# Patient Record
Sex: Female | Born: 1995
Health system: Southern US, Community
[De-identification: ages and names within clinical notes are randomized; demographics above are authoritative.]

## PROBLEM LIST (undated history)

## (undated) ENCOUNTER — Inpatient Hospital Stay (HOSPITAL_COMMUNITY): Payer: Self-pay

## (undated) DIAGNOSIS — L309 Dermatitis, unspecified: Secondary | ICD-10-CM

## (undated) DIAGNOSIS — Z8759 Personal history of other complications of pregnancy, childbirth and the puerperium: Secondary | ICD-10-CM

## (undated) DIAGNOSIS — Z789 Other specified health status: Secondary | ICD-10-CM

## (undated) HISTORY — PX: NO PAST SURGERIES: SHX2092

## (undated) HISTORY — DX: Personal history of other complications of pregnancy, childbirth and the puerperium: Z87.59

---
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
## 2015-09-01 ENCOUNTER — Encounter: Payer: Self-pay | Admitting: Pediatrics

## 2015-09-01 NOTE — Progress Notes (Signed)
Pre-Visit Planning  [name] Schier  is a 20 y.o. female referred by Venia MinksSIMHA,SHRUTI VIJAYA, MD.   [facility] on 06/11/13 for nexplanon placement.   Previous Psych Screenings? no  Treatment plan at last visit included nexplanon placement.   Clinical Staff Visit Tasks:   - Urine GC/CT due? yes - Psych Screenings Due? no - assess concern for visit in triage-- if vaginal bleeding or odor have patient undress from waist down and prep for pelvic  - offer flu shot  Provider Visit Tasks: - discuss current concerns  - [facility] - Pertinent Labs? no

## 2015-09-02 ENCOUNTER — Ambulatory Visit (INDEPENDENT_AMBULATORY_CARE_PROVIDER_SITE_OTHER): Payer: Medicaid Other | Admitting: Pediatrics

## 2015-09-02 ENCOUNTER — Other Ambulatory Visit: Payer: Self-pay | Admitting: Pediatrics

## 2015-09-02 ENCOUNTER — Encounter: Payer: Self-pay | Admitting: Pediatrics

## 2015-09-02 VITALS — BP 126/69 | HR 92 | Ht 61.26 in | Wt 202.0 lb

## 2015-09-02 DIAGNOSIS — Z113 Encounter for screening for infections with a predominantly sexual mode of transmission: Secondary | ICD-10-CM

## 2015-09-02 DIAGNOSIS — R51 Headache: Secondary | ICD-10-CM

## 2015-09-02 DIAGNOSIS — R635 Abnormal weight gain: Secondary | ICD-10-CM | POA: Diagnosis not present

## 2015-09-02 DIAGNOSIS — N921 Excessive and frequent menstruation with irregular cycle: Secondary | ICD-10-CM | POA: Diagnosis not present

## 2015-09-02 DIAGNOSIS — R519 Headache, unspecified: Secondary | ICD-10-CM

## 2015-09-02 DIAGNOSIS — Z975 Presence of (intrauterine) contraceptive device: Secondary | ICD-10-CM

## 2015-09-02 LAB — HEMOGLOBIN A1C
Hgb A1c MFr Bld: 5.9 % — ABNORMAL HIGH (ref <5.7)
MEAN PLASMA GLUCOSE: 123 mg/dL — AB (ref <117)

## 2015-09-02 LAB — CBC
HCT: 41.9 % (ref 36.0–46.0)
HEMOGLOBIN: 13.4 g/dL (ref 12.0–15.0)
MCH: 24.5 pg — AB (ref 26.0–34.0)
MCHC: 32 g/dL (ref 30.0–36.0)
MCV: 76.5 fL — ABNORMAL LOW (ref 78.0–100.0)
MPV: 11.1 fL (ref 8.6–12.4)
Platelets: 210 10*3/uL (ref 150–400)
RBC: 5.48 MIL/uL — AB (ref 3.87–5.11)
RDW: 16 % — ABNORMAL HIGH (ref 11.5–15.5)
WBC: 6.2 10*3/uL (ref 4.0–10.5)

## 2015-09-02 NOTE — Progress Notes (Signed)
THIS RECORD MAY CONTAIN CONFIDENTIAL INFORMATION THAT SHOULD NOT BE RELEASED WITHOUT REVIEW OF THE SERVICE PROVIDER.  Adolescent Medicine Consultation Follow-Up Visit Grace Harper  is a 20 y.o. female referred by Marijo File, MD here today for follow-up.    Previsit planning completed:  Yes  Pre-Visit Planning  Grace Harper is a 20 y.o. female referred by Venia Minks, MD.  Last seen in [facility] on 06/11/13 for nexplanon placement.   Previous Psych Screenings? no  Treatment plan at last visit included nexplanon placement.   Clinical Staff Visit Tasks:  - Urine GC/CT due? yes - Psych Screenings Due? no - assess concern for visit in triage-- if vaginal bleeding or odor have patient undress from waist down and prep for pelvic  - offer flu shot  Provider Visit Tasks: - discuss current concerns  - [facility] Involvement? Unknown - Pertinent Labs? no   Growth Chart Viewed? yes   History was provided by the patient.  PCP Confirmed?  yes  My Chart Activated?   yes   HPI:   Feels like she can't lose weight and it is stressing her out. Has been on phentermine (a few months ago) and has been exercising and eating healthy. Going to a clinicEcologist for weight loss. Has been going for about 3-4 months. Stopped phentermine 2 months ago-- feels like it wasn't helping at all. Goes to [employer] a week. Does treadmill, bike, crunch machine. Walking at a 2.0 on treadmill x 30 minutes. Was eating baked fish, chicken, veggies. Was doing about 2 weeks of no carb but feels like it didn't help. Prior to dieting- eating ok but continue to drink a lot of sugar. Continues with 2-4 sugary drinks a day. Was a former track runner prior to pregnancy.   She is getting bad headaches. They last about a day. Takes tylenol and advil but they don't go away. They are frontal. She has to sleep to get it away. Happening about twice a month. No aura. Was having them  some before nexplanon.   Periods are lasting 3-4 weeks. They are heavy. Periods were 3 days and light- they became heavier around November and have been lasting longer. Sexually same partner since last visit. Not using condoms. Some lower back pain but likely unrelated. No pain with sex or odor. No unusual discharge. No pain with urination.   She has looked a lot on the internet about different BC methods. Forgets to take pills. Hasn't considered IUD before. Thinks maybe she will just use condoms if she switches methods although she is able to verbalize how unreliable this is.   PHQ-SADS 09/04/2015  PHQ-15 2  GAD-7 0  PHQ-9 0  Suicidal Ideation No    Patient's last menstrual period was 07/19/2015. No Known Allergies Outpatient Encounter Prescriptions as of 09/02/2015  Medication Sig  . etonogestrel (NEXPLANON) 68 MG IMPL implant Inject 1 each (68 mg total) into the skin once.  . [DISCONTINUED] promethazine (PHENERGAN) 25 MG tablet Take 1 tablet (25 mg total) by mouth every 6 (six) hours as needed for nausea.  . [DISCONTINUED] promethazine (PHENERGAN) 25 MG tablet Take 1 tablet (25 mg total) by mouth every 6 (six) hours as needed for nausea or vomiting.   No facility-administered encounter medications on file as of 09/02/2015.     Patient Active Problem List   Diagnosis Date Noted  . Teenage mother 06/11/2013  . Contraception management 06/11/2013  . Presence of subdermal contraceptive  device 06/11/2013     Social History   Social History Narrative    Review of Systems  Constitutional: Negative for weight loss and malaise/fatigue.  Eyes: Negative for blurred vision.  Respiratory: Negative for shortness of breath.   Cardiovascular: Negative for chest pain and palpitations.  Gastrointestinal: Negative for nausea, vomiting, abdominal pain and constipation.  Genitourinary: Negative for dysuria.  Musculoskeletal: Negative for myalgias.  Neurological: Positive for headaches. Negative  for dizziness.  Psychiatric/Behavioral: Negative for depression.     The following portions of the patient's history were reviewed and updated as appropriate: allergies, current medications, past family history, past medical history, past social history and problem list.  Physical Exam:  Filed Vitals:   09/02/15 1041  BP: 126/69  Pulse: 92  Height: 5' 1.26" (1.556 m)  Weight: 202 lb (91.627 kg)   BP 126/69 mmHg  Pulse 92  Ht 5' 1.26" (1.556 m)  Wt 202 lb (91.627 kg)  BMI 37.84 kg/m2  LMP 07/19/2015 Body mass index: body mass index is 37.84 kg/(m^2). Blood pressure percentiles are 97% systolic and 75% diastolic based on 2000 NHANES data. Blood pressure percentile targets: 90: 120/76, 95: 123/79, 99 + 5 mmHg: 136/92.  Physical Exam  Constitutional: She is oriented to person, place, and time. She appears well-developed and well-nourished.  HENT:  Head: Normocephalic.  Neck: No thyromegaly present.  Mild acanthosis  Cardiovascular: Normal rate, regular rhythm, normal heart sounds and intact distal pulses.   Pulmonary/Chest: Effort normal and breath sounds normal.  Abdominal: Soft. Bowel sounds are normal. There is no tenderness.  Musculoskeletal: Normal range of motion.  Neurological: She is alert and oriented to person, place, and time.  Skin: Skin is warm and dry.  Psychiatric: She has a normal mood and affect.    Assessment/Plan: 1. Breakthrough bleeding on Nexplanon Screened for infection. Gc/chlamydia negative. + for BV. Will treat to see if this resolves BTB. If not, will attempt OCP coverage to regulate bleeding.  - WET PREP BY MOLECULAR PROBE  2. Presence of subdermal contraceptive device Implant in place. Due for removal or reinsertion in October.  - WET PREP BY MOLECULAR PROBE  3. Weight gain Patient feels like weight gain is related to contraceptive device, however, in discussing history her intake + lack of strenuous exercise seem to be the culprit. Given that  she hasn't had any labs in some time, will screen for any other causes of weight gain. Will review these labs in 2 weeks at visit. Patient is agreeable to dietitian referral and workout suggestions prior to wanting nexplanon removed.  - Comprehensive metabolic panel - Hemoglobin A1c - TSH - CBC - Lipid panel - Amb ref to Medical Nutrition Therapy-MNT  4. Generalized headaches Headaches likely unrelated to nexplanon, however, can continue to assess further. Could benefit from topamax for headaches + appetite in the future.   5. Screening examination for venereal disease Negative.  - GC/chlamydia probe amp, urine - WET PREP BY MOLECULAR PROBE   Follow-up:  2 weeks    Medical decision-making:  > 40 minutes spent, more than 50% of appointment was spent discussing diagnosis and management of symptoms

## 2015-09-02 NOTE — Patient Instructions (Addendum)
Treadmill at at least a 3.5 for 30 minutes 4x a week  Check out [employer] app on your phone  Find sugar free beverages- 1 pepsi a day is a pound a month weight gain!  Continue teas without sugar  Add lemons to tea or water   Go see dietitian   I will send labs today for your bleeding- we will make sure no infection If not, I will send pills to take daily to get it regulated again.   Dietitian will call you for an appointment.  Wellbutrin   [email or web address]

## 2015-09-03 ENCOUNTER — Other Ambulatory Visit: Payer: Self-pay | Admitting: Pediatrics

## 2015-09-03 DIAGNOSIS — N760 Acute vaginitis: Principal | ICD-10-CM

## 2015-09-03 DIAGNOSIS — B9689 Other specified bacterial agents as the cause of diseases classified elsewhere: Secondary | ICD-10-CM

## 2015-09-03 LAB — LIPID PANEL
CHOL/HDL RATIO: 5.6 ratio — AB (ref <=5.0)
CHOLESTEROL: 208 mg/dL — AB (ref 125–170)
HDL: 37 mg/dL (ref 36–76)
LDL Cholesterol: 147 mg/dL — ABNORMAL HIGH (ref <110)
Triglycerides: 118 mg/dL (ref 40–136)
VLDL: 24 mg/dL (ref <30)

## 2015-09-03 LAB — COMPREHENSIVE METABOLIC PANEL
ALBUMIN: 4.2 g/dL (ref 3.6–5.1)
ALT: 11 U/L (ref 5–32)
AST: 15 U/L (ref 12–32)
Alkaline Phosphatase: 109 U/L (ref 47–176)
BILIRUBIN TOTAL: 0.3 mg/dL (ref 0.2–1.1)
BUN: 12 mg/dL (ref 7–20)
CALCIUM: 9.4 mg/dL (ref 8.9–10.4)
CO2: 27 mmol/L (ref 20–31)
CREATININE: 0.79 mg/dL (ref 0.50–1.00)
Chloride: 102 mmol/L (ref 98–110)
Glucose, Bld: 91 mg/dL (ref 65–99)
Potassium: 5 mmol/L (ref 3.8–5.1)
Sodium: 137 mmol/L (ref 135–146)
TOTAL PROTEIN: 7.3 g/dL (ref 6.3–8.2)

## 2015-09-03 LAB — WET PREP BY MOLECULAR PROBE
Candida species: NEGATIVE
Gardnerella vaginalis: POSITIVE — AB
Trichomonas vaginosis: NEGATIVE

## 2015-09-03 LAB — GC/CHLAMYDIA PROBE AMP
CT Probe RNA: NOT DETECTED
GC PROBE AMP APTIMA: NOT DETECTED

## 2015-09-03 LAB — TSH: TSH: 2.046 u[IU]/mL (ref 0.350–4.500)

## 2015-09-03 MED ORDER — METRONIDAZOLE 500 MG PO TABS: 500.0000 mg | ORAL_TABLET | Freq: Two times a day (BID) | ORAL | Status: DC

## 2015-09-04 ENCOUNTER — Telehealth: Payer: Self-pay | Admitting: *Deleted

## 2015-09-04 DIAGNOSIS — R51 Headache: Secondary | ICD-10-CM

## 2015-09-04 DIAGNOSIS — Z975 Presence of (intrauterine) contraceptive device: Principal | ICD-10-CM

## 2015-09-04 DIAGNOSIS — N921 Excessive and frequent menstruation with irregular cycle: Secondary | ICD-10-CM | POA: Insufficient documentation

## 2015-09-04 DIAGNOSIS — R635 Abnormal weight gain: Secondary | ICD-10-CM | POA: Insufficient documentation

## 2015-09-04 DIAGNOSIS — R519 Headache, unspecified: Secondary | ICD-10-CM | POA: Insufficient documentation

## 2015-09-04 NOTE — Telephone Encounter (Signed)
-----   Message from Verneda Skill, FNP sent at 09/03/2015  4:45 PM EST ----- Gc/chlamydia negative. Swab positive for BV. I have sent over flagyl 500 mg BID x 7 days for this. If bleeding hasn't stopped at next visit in 2 weeks, will start OCP for her. We will discuss other labs at her next visit in 2 weeks.

## 2015-09-04 NOTE — Telephone Encounter (Signed)
LVM requesting call back to discuss labs and necessary f/u. Phone number provided.

## 2015-09-04 NOTE — Telephone Encounter (Addendum)
TC returned by pt. Advised pt of BV dx, meds, and f/u appt. Answered all of pt's questions to satisfaction. Pt verbalized understanding of treatment plan. Will call back if questions arise.

## 2015-09-16 ENCOUNTER — Ambulatory Visit (INDEPENDENT_AMBULATORY_CARE_PROVIDER_SITE_OTHER): Payer: Medicaid Other | Admitting: Pediatrics

## 2015-09-16 ENCOUNTER — Encounter: Payer: Self-pay | Admitting: Pediatrics

## 2015-09-16 VITALS — BP 120/65 | HR 83 | Ht 60.83 in | Wt 203.4 lb

## 2015-09-16 DIAGNOSIS — E785 Hyperlipidemia, unspecified: Secondary | ICD-10-CM | POA: Insufficient documentation

## 2015-09-16 DIAGNOSIS — Z975 Presence of (intrauterine) contraceptive device: Secondary | ICD-10-CM

## 2015-09-16 DIAGNOSIS — A499 Bacterial infection, unspecified: Secondary | ICD-10-CM | POA: Diagnosis not present

## 2015-09-16 DIAGNOSIS — L309 Dermatitis, unspecified: Secondary | ICD-10-CM

## 2015-09-16 DIAGNOSIS — N921 Excessive and frequent menstruation with irregular cycle: Secondary | ICD-10-CM | POA: Diagnosis not present

## 2015-09-16 DIAGNOSIS — B9689 Other specified bacterial agents as the cause of diseases classified elsewhere: Secondary | ICD-10-CM

## 2015-09-16 DIAGNOSIS — N760 Acute vaginitis: Secondary | ICD-10-CM

## 2015-09-16 DIAGNOSIS — R7303 Prediabetes: Secondary | ICD-10-CM | POA: Insufficient documentation

## 2015-09-16 DIAGNOSIS — R635 Abnormal weight gain: Secondary | ICD-10-CM

## 2015-09-16 MED ORDER — TRIAMCINOLONE ACETONIDE 0.1 % EX CREA: 1.0000 "application " | TOPICAL_CREAM | Freq: Two times a day (BID) | CUTANEOUS | Status: DC

## 2015-09-16 MED ORDER — METRONIDAZOLE 500 MG PO TABS: 500.0000 mg | ORAL_TABLET | Freq: Two times a day (BID) | ORAL | Status: DC

## 2015-09-16 MED FILL — metroNIDAZOLE 500 MG TABS: 500 | 7 days supply | Qty: 14 | Fill #0

## 2015-09-16 MED FILL — TRIAMCINOLONE 0.1% CREAM: 0.1 | 14 days supply | Qty: 45 | Fill #0

## 2015-09-16 NOTE — Progress Notes (Signed)
THIS RECORD MAY CONTAIN CONFIDENTIAL INFORMATION THAT SHOULD NOT BE RELEASED WITHOUT REVIEW OF THE SERVICE PROVIDER.  Adolescent Medicine Consultation Follow-Up Visit Grace Harper  is a 20 y.o. female referred by Marijo File, MD here today for follow-up.    Previsit planning completed:  No  Results for orders placed or performed in visit on 09/02/15  WET PREP BY MOLECULAR PROBE  Result Value Ref Range   Candida species NEG Negative   Trichomonas vaginosis NEG Negative   Gardnerella vaginalis POS (A) Negative  Comprehensive metabolic panel  Result Value Ref Range   Sodium 137 135 - 146 mmol/L   Potassium 5.0 3.8 - 5.1 mmol/L   Chloride 102 98 - 110 mmol/L   CO2 27 20 - 31 mmol/L   Glucose, Bld 91 65 - 99 mg/dL   BUN 12 7 - 20 mg/dL   Creat 0.96 0.45 - 4.09 mg/dL   Total Bilirubin 0.3 0.2 - 1.1 mg/dL   Alkaline Phosphatase 109 47 - 176 U/L   AST 15 12 - 32 U/L   ALT 11 5 - 32 U/L   Total Protein 7.3 6.3 - 8.2 g/dL   Albumin 4.2 3.6 - 5.1 g/dL   Calcium 9.4 8.9 - 81.1 mg/dL  Hemoglobin B1Y  Result Value Ref Range   Hgb A1c MFr Bld 5.9 (H) <5.7 %   Mean Plasma Glucose 123 (H) <117 mg/dL  TSH  Result Value Ref Range   TSH 2.046 0.350 - 4.500 uIU/mL  CBC  Result Value Ref Range   WBC 6.2 4.0 - 10.5 K/uL   RBC 5.48 (H) 3.87 - 5.11 MIL/uL   Hemoglobin 13.4 12.0 - 15.0 g/dL   HCT 78.2 95.6 - 21.3 %   MCV 76.5 (L) 78.0 - 100.0 fL   MCH 24.5 (L) 26.0 - 34.0 pg   MCHC 32.0 30.0 - 36.0 g/dL   RDW 08.6 (H) 57.8 - 46.9 %   Platelets 210 150 - 400 K/uL   MPV 11.1 8.6 - 12.4 fL  Lipid panel  Result Value Ref Range   Cholesterol 208 (H) 125 - 170 mg/dL   Triglycerides 629 40 - 136 mg/dL   HDL 37 36 - 76 mg/dL   Total CHOL/HDL Ratio 5.6 (H) <=5.0 Ratio   VLDL 24 <30 mg/dL   LDL Cholesterol 528 (H) <110 mg/dL    Growth Chart Viewed? yes   History was provided by the patient.  PCP Confirmed?  yes  My Chart Activated?   yes   HPI:    Had a migraine about 3 days  ago. Went to sleep and slept through the night and it went away.  Started diet green tea, water, baked chicken, veggies.  Has been to workout about 3 times at the gym. Did treadmill and went at 3.5-4.2 speed. Did leg exercises. Very sore today.  Hasn't been bleeding since last period.  Although she continues to be concerned about weight gain related to her nexplanon, she is able to rationalize she doesn't want to get pregnant.   Mom and grandma has diabetes. Mom is on insulin. She has never been told she has prediabetes in the past.   She is due for new contraception in October. Feels like she would like IUD next. Discussed process of nexplanon removal and IUD insertion on the same day.     Patient's last menstrual period was 08/19/2015. No Known Allergies Outpatient Encounter Prescriptions as of 09/16/2015  Medication Sig  . etonogestrel (NEXPLANON)  68 MG IMPL implant Inject 1 each (68 mg total) into the skin once.  . metroNIDAZOLE (FLAGYL) 500 MG tablet Take 1 tablet (500 mg total) by mouth 2 (two) times daily.   No facility-administered encounter medications on file as of 09/16/2015.    Review of Systems  Constitutional: Negative for weight loss and malaise/fatigue.  Eyes: Negative for blurred vision.  Respiratory: Negative for shortness of breath.   Cardiovascular: Negative for chest pain and palpitations.  Gastrointestinal: Negative for nausea, vomiting, abdominal pain and constipation.  Genitourinary: Negative for dysuria.  Musculoskeletal: Negative for myalgias.  Neurological: Positive for headaches. Negative for dizziness.  Psychiatric/Behavioral: Negative for depression.     Patient Active Problem List   Diagnosis Date Noted  . Weight gain 09/04/2015  . Breakthrough bleeding on Nexplanon 09/04/2015  . Generalized headaches 09/04/2015  . Teenage mother 06/11/2013  . Contraception management 06/11/2013  . Presence of subdermal contraceptive device 06/11/2013     Social  History   Social History Narrative     The following portions of the patient's history were reviewed and updated as appropriate: allergies, current medications, past family history, past medical history, past social history and problem list.  Physical Exam:  Filed Vitals:   09/16/15 0928  BP: 120/65  Pulse: 83  Height: 5' 0.83" (1.545 m)  Weight: 203 lb 6.4 oz (92.262 kg)   BP 120/65 mmHg  Pulse 83  Ht 5' 0.83" (1.545 m)  Wt 203 lb 6.4 oz (92.262 kg)  BMI 38.65 kg/m2  LMP 08/19/2015 Body mass index: body mass index is 38.65 kg/(m^2). Blood pressure percentiles are 91% systolic and 63% diastolic based on 2000 NHANES data. Blood pressure percentile targets: 90: 119/75, 95: 123/79, 99 + 5 mmHg: 135/92.  Physical Exam  Constitutional: She is oriented to person, place, and time. She appears well-developed and well-nourished.  HENT:  Head: Normocephalic.  Neck: No thyromegaly present.  Cardiovascular: Normal rate, regular rhythm, normal heart sounds and intact distal pulses.   Pulmonary/Chest: Effort normal and breath sounds normal.  Abdominal: Soft. Bowel sounds are normal. There is no tenderness.  Musculoskeletal: Normal range of motion.  Neurological: She is alert and oriented to person, place, and time.  Skin: Skin is warm and dry.  Psychiatric: She has a normal mood and affect.    Assessment/Plan: 1. Prediabetes Discussed risks given family history today. She feels committed to trying lifestyle changes vs. Adding metformin. Will work on those for 3 months.   2. Breakthrough bleeding on Nexplanon No significant bleeding since last period. Will monitor.   3. Weight gain Will monitor. She has made some changes that will hopefully help with weight loss.   4. Bacterial vaginosis She was unable to pick up flagyl after last appointment due to only having family planning medicaid. [facility].  - metroNIDAZOLE (FLAGYL) 500 MG  tablet; Take 1 tablet (500 mg total) by mouth 2 (two) times daily.  Dispense: 14 tablet; Refill: 0  5. Eczema Spots on hands, ankles, knees. Has had in the past. Will try triamcinolone and aquaphor.  - triamcinolone cream (KENALOG) 0.1 %; Apply 1 application topically 2 (two) times daily.  Dispense: 45 g; Refill: 3   Follow-up:  3 months for IUD insertion and nexplanon removal.   Medical decision-making:  > 40 minutes spent, more than 50% of appointment was spent discussing diagnosis and management of symptoms

## 2015-09-16 NOTE — Patient Instructions (Addendum)
Work up to 5 days a week working out!   Call us to schedule sooner if you want for IUD + nexplanon removal.   Go see dietitian.   Aquaphor for eczema spots with triamcinolone cream

## 2015-09-28 ENCOUNTER — Ambulatory Visit: Payer: Self-pay | Admitting: Skilled Nursing Facility1

## 2015-12-13 ENCOUNTER — Encounter: Payer: Self-pay | Admitting: Family

## 2015-12-13 NOTE — Progress Notes (Signed)
[name], female   DOB: 10-02-1995, 20 y.o.   MRN: [id number] Pre-Visit Planning  [name]  is a 20 y.o. female referred by Venia MinksSIMHA,SHRUTI VIJAYA, MD.   Last seen in [facility] on 09/16/15 for prediabetes, BTB with Nexplanon.   Date and Type of Previous Psych Screenings? NA  Clinical Staff Visit Tasks:   - Urine GC/CT due? no - HIV Screening due?  no - Psych Screenings Due? No - Prepare for Nexplanon removal/IUD insertion -uhcg, poct hgb  Provider Visit Tasks: - Assess contraceptive concerns, confirm desired method, proceed with insertions/removal - [facility] - Pertinent Labs? No  >2 minutes spent reviewing records and planning for patient's visit.

## 2015-12-14 ENCOUNTER — Ambulatory Visit (INDEPENDENT_AMBULATORY_CARE_PROVIDER_SITE_OTHER): Payer: Medicaid Other | Admitting: Family

## 2015-12-14 ENCOUNTER — Encounter: Payer: Self-pay | Admitting: Family

## 2015-12-14 VITALS — BP 121/70 | HR 79 | Ht 61.0 in | Wt 207.4 lb

## 2015-12-14 DIAGNOSIS — Z30430 Encounter for insertion of intrauterine contraceptive device: Secondary | ICD-10-CM | POA: Diagnosis not present

## 2015-12-14 DIAGNOSIS — Z113 Encounter for screening for infections with a predominantly sexual mode of transmission: Secondary | ICD-10-CM

## 2015-12-14 DIAGNOSIS — Z3046 Encounter for surveillance of implantable subdermal contraceptive: Secondary | ICD-10-CM | POA: Diagnosis not present

## 2015-12-14 DIAGNOSIS — Z130 Encounter for screening for diseases of the blood and blood-forming organs and certain disorders involving the immune mechanism: Secondary | ICD-10-CM

## 2015-12-14 DIAGNOSIS — Z3202 Encounter for pregnancy test, result negative: Secondary | ICD-10-CM

## 2015-12-14 LAB — POCT HEMOGLOBIN: Hemoglobin: 13.1 g/dL (ref 12.2–16.2)

## 2015-12-14 LAB — POCT URINE PREGNANCY: Preg Test, Ur: NEGATIVE

## 2015-12-14 MED ORDER — LEVONORGESTREL 20 MCG/24HR IU IUD: 1.0000 | INTRAUTERINE_SYSTEM | Freq: Once | INTRAUTERINE | Status: DC

## 2015-12-14 MED ORDER — LEVONORGESTREL 20 MCG/24HR IU IUD
INTRAUTERINE_SYSTEM | Freq: Once | INTRAUTERINE | Status: AC
  Administered 2015-12-14: 16:00:00 via INTRAUTERINE

## 2015-12-14 NOTE — Patient Instructions (Signed)

## 2015-12-14 NOTE — Procedures (Signed)
Mirena IUD Insertion   The pt presents for Mirena IUD placement.  No contraindications for placement.     Patient's last menstrual period was 11/13/2015.  [facility]: nexplanon removed same day    Risks & benefits of IUD discussed  The [facility]  Packaging instructions supplied to patient  Consent form signed.  The patient denies any allergies to anesthetics or antiseptics.   Procedure:  Pt was placed in lithotomy position.  Uterus was palpated and noted in anterior position.  Speculum was inserted.  GC/CT swab was used to collect sample for STI testing.  Tenaculum was used to stabilize the cervix by clasping at 12 o'clock  Betadine was used to clean the cervix and cervical os.  The uterus was sounded to 6.5 cm.  Mirena was inserted using manufacturer provided applicator.  Strings were trimmed to 3 cm external to os.  Tenaculum was removed.  Speculum was removed.   The patient was advised to move slowly from a supine to an upright position   The patient denied any concerns or complaints   The patient was instructed to schedule a follow-up appt in 1 month and to call sooner if any concerns.   The patient acknowledged agreement and understanding of the plan.

## 2015-12-14 NOTE — Progress Notes (Signed)
THIS RECORD MAY CONTAIN CONFIDENTIAL INFORMATION THAT SHOULD NOT BE RELEASED WITHOUT REVIEW OF THE SERVICE PROVIDER.  Adolescent Medicine Consultation Follow-Up Visit Grace Harper  is a 20 y.o. female referred by Grace Harper, Grace V, MD here today for follow-up.    Previsit planning completed:  Yes [name], female   DOB: [birth year], 20 y.o.   MRN: [id number] Pre-Visit Planning  Grace Harper  is a 20 y.o. female referred by Grace MinksSIMHA,Grace VIJAYA, MD.   Last seen in [facility] on 09/16/15 for prediabetes, BTB with Nexplanon.   Date and Type of Previous Psych Screenings? NA  Clinical Staff Visit Tasks:   - Urine GC/CT due? no - HIV Screening due?  no - Psych Screenings Due? No - Prepare for Nexplanon removal/IUD insertion -uhcg, poct hgb  Provider Visit Tasks: - Assess contraceptive concerns, confirm desired method, proceed with insertions/removal - [facility]? No - Pertinent Labs? No  >2 minutes spent reviewing records and planning for patient's visit. Growth Chart Viewed? no   History was provided by the patient.  PCP Confirmed?  Yes, Grace BrideShruti Simha, MD  My Chart Activated?  Yes  HPI:    -20 yo G1P1 presents for change in contraceptive method. Nexplanon palpable in L arm.  -no vaginal discharge changes, VB, lesions, pelvic or abdominal pain  -Desires IUD.   Patient's last menstrual period was 11/13/2015. No Known Allergies Outpatient Prescriptions Prior to Visit  Medication Sig Dispense Refill  . etonogestrel (NEXPLANON) 68 MG IMPL implant Inject 1 each (68 mg total) into the skin once. 1 each 0  . triamcinolone cream (KENALOG) 0.1 % Apply 1 application topically 2 (two) times daily. (Patient not taking: Reported on 12/14/2015) 45 g 3  . metroNIDAZOLE (FLAGYL) 500 MG tablet Take 1 tablet (500 mg total) by mouth 2 (two) times daily. 14 tablet 0   No facility-administered medications prior to visit.     Patient Active Problem  List   Diagnosis Date Noted  . Prediabetes 09/16/2015  . Hyperlipidemia 09/16/2015  . Weight gain 09/04/2015  . Breakthrough bleeding on Nexplanon 09/04/2015  . Generalized headaches 09/04/2015  . Teenage mother 06/11/2013  . Contraception management 06/11/2013  . Presence of subdermal contraceptive device 06/11/2013    Confidentiality was discussed with the patient and if applicable, with caregiver as well.  Patient's personal or confidential phone number: [phone]  The following portions of the patient's history were reviewed and updated as appropriate: allergies, current medications, past family history, past medical history, past social history, past surgical history and problem list.  Physical Exam:  Filed Vitals:   12/14/15 1407  BP: 121/70  Pulse: 79  Height: 5\' 1"  (1.549 m)  Weight: 207 lb 6.4 oz (94.076 kg)   BP 121/70 mmHg  Pulse 79  Ht 5\' 1"  (1.549 m)  Wt 207 lb 6.4 oz (94.076 kg)  BMI 39.21 kg/m2  LMP 11/13/2015 Body mass index: body mass index is 39.21 kg/(m^2). Facility age limit for growth percentiles is 20 years.  Physical Exam  Constitutional: She is oriented to person, place, and time. She appears well-developed and well-nourished.  HENT:  Head: Normocephalic.  Neck: No thyromegaly present.  Cardiovascular: Normal rate, regular rhythm, normal heart sounds and intact distal pulses.   Pulmonary/Chest: Effort normal and breath sounds normal.  Abdominal: Soft. Bowel sounds are normal. There is no tenderness.  Genitourinary: Vagina normal and uterus normal. No vaginal discharge found.  Cervix WNL, no CMT.   Musculoskeletal: Normal range  of motion.  Neurological: She is alert and oriented to person, place, and time.  Skin: Skin is warm and dry.  Nexplanon palpable L arm; removed without incident prior to IUD insertion   Psychiatric: She has a normal mood and affect.     Assessment/Plan: 1. Nexplanon removal -Risks & benefits of Nexplanon removal  discussed. Consent form signed.  The patient denies any allergies to anesthetics or antiseptics.  Procedure: Pt was placed in supine position. left arm was flexed at the elbow and externally rotated so that her wrist was parallel to her ear, The device was palpated and marked. The site was cleaned with Betadine. The area surrounding the device was covered with a sterile drape. 1% lidocaine was injected just under the device. A scalpel was used to create a small incision. The device was pushed towards the incision. Fibrous tissue surrounding the device was gradually removed from the device. The device was removed and measured to ensure all 4 cm of device was removed. Steri-strips were used to close the incision. Pressure dressing was applied to the patient.  The patient was instructed to removed the pressure dressing in 24 hrs.  The patient was advised to move slowly from a supine to an upright position  The patient denied any concerns or complaints  The patient was instructed to schedule a follow-up appt in 1 month. The patient will be called in 1 week to address any concerns.  2. Encounter for insertion of mirena IUD -as per procedure note. Dr. Marina Goodell attending.   3. Pregnancy examination or test, negative result -negative per protocol - POCT urine pregnancy  4. Screening for iron deficiency anemia -13.1, stable - POCT hemoglobin  5. Routine screening for STI (sexually transmitted infection) -per protocol - GC/Chlamydia Probe Amp  Follow-up:  Return in about 4 weeks (around 01/11/2016) for with Grace Dolin, FNP-C.   Medical decision-making:  > 25 minutes spent, more than 50% of appointment was spent discussing diagnosis and management of symptoms

## 2015-12-15 ENCOUNTER — Telehealth: Payer: Self-pay | Admitting: *Deleted

## 2015-12-15 LAB — GC/CHLAMYDIA PROBE AMP
CT PROBE, AMP APTIMA: NOT DETECTED
GC PROBE AMP APTIMA: NOT DETECTED

## 2015-12-15 NOTE — Telephone Encounter (Signed)
TC to pt. LVM that labs WNL.

## 2015-12-15 NOTE — Telephone Encounter (Signed)
-----   Message from Christianne Dolinhristy Millican, NP sent at 12/15/2015  1:48 PM EDT ----- Negative gc/c. Notify pt. Thx -cm

## 2015-12-17 ENCOUNTER — Ambulatory Visit: Payer: Self-pay | Admitting: Pediatrics

## 2016-01-11 ENCOUNTER — Ambulatory Visit: Payer: Medicaid Other | Admitting: Pediatrics

## 2016-01-13 ENCOUNTER — Encounter: Payer: Self-pay | Admitting: Pediatrics

## 2016-01-13 ENCOUNTER — Ambulatory Visit (INDEPENDENT_AMBULATORY_CARE_PROVIDER_SITE_OTHER): Payer: Medicaid Other | Admitting: Pediatrics

## 2016-01-13 VITALS — BP 152/73 | HR 80 | Ht 61.42 in | Wt 213.6 lb

## 2016-01-13 DIAGNOSIS — Z30431 Encounter for routine checking of intrauterine contraceptive device: Secondary | ICD-10-CM

## 2016-01-13 NOTE — Progress Notes (Signed)
THIS RECORD MAY CONTAIN CONFIDENTIAL INFORMATION THAT SHOULD NOT BE RELEASED WITHOUT REVIEW OF THE SERVICE PROVIDER.  Adolescent Medicine Consultation Follow-Up Visit Grace Harper  is a 20 y.o. female referred by Marijo FileSimha, Shruti V, MD here today for follow-up.    Previsit planning completed:  yes  Growth Chart Viewed? yes   History was provided by the patient.  PCP Confirmed?  yes  My Chart Activated?   yes   HPI:   Patient is here for follow=up after IUD insertion. She reports some vaginal bleeding for ~2 weeks after insertion. Now has stopped but she experiences occasional cramping. No vaginal discharge or irritation. No dyspareunia. Patient states boyfriend claims to be able to feel the strings, but denies poking or pain. Overall very pleased w/ IUD.  Denies: HA, dizziness, mood changes, fever, SOB, CP, abdom pain, n/v/d, or dysuria.   Patient's last menstrual period was 11/15/2015. No Known Allergies Outpatient Prescriptions Prior to Visit  Medication Sig Dispense Refill  . triamcinolone cream (KENALOG) 0.1 % Apply 1 application topically 2 (two) times daily. 45 g 3  . etonogestrel (NEXPLANON) 68 MG IMPL implant Inject 1 each (68 mg total) into the skin once. 1 each 0   No facility-administered medications prior to visit.     Patient Active Problem List   Diagnosis Date Noted  . IUD check up 01/13/2016  . Prediabetes 09/16/2015  . Hyperlipidemia 09/16/2015  . Weight gain 09/04/2015  . Generalized headaches 09/04/2015  . Teenage mother 06/11/2013  . Contraception management 06/11/2013    Social History: Lives with:  mother, brother, sister, and son and describes home situation as positive  Confidentiality was discussed with the patient and if applicable, with caregiver as well.  Physical Exam:  Filed Vitals:   01/13/16 1120  BP: 152/73  Pulse: 80  Height: 5' 1.42" (1.56 m)  Weight: 213 lb 9.6 oz (96.888 kg)   BP 152/73 mmHg  Pulse 80  Ht 5' 1.42" (1.56 m)   Wt 213 lb 9.6 oz (96.888 kg)  BMI 39.81 kg/m2  LMP 11/15/2015 Body mass index: body mass index is 39.81 kg/(m^2). Facility age limit for growth percentiles is 20 years.  Physical Exam General -- oriented x3, pleasant and cooperative. HEENT -- Head is normocephalic. PERRLA. EOMI. Ears, nose and throat were benign. Integument -- intact. No rash, erythema, or ecchymoses.  Chest -- good expansion. Lungs clear to auscultation. Cardiac -- RRR. No murmurs noted.  Abdomen -- soft, nontender. No masses palpable. Bowel sounds present. Female genitalia: normal external genitalia, vulva, vagina, cervix, uterus and adnexa Cervix: normal appearing cervix without discharge or lesions and IUD strings noted at os w/ ends curling posteriorly.   Assessment/Plan: IUD check up Patient denies any significant issues or symptoms at this time. Occasional cramping but she denies any effect on daily activities.  - visual assessment of IUD placement yielded visualization of IUD strings in proper placement suggestive of proper device placement. No irritation or friability to cervix. Patient tolerated procedure well and Nurse was present for exam. - F/u in 6 months.    Follow-up:  Return in about 6 months (around 07/15/2016).   Medical decision-making:  > 25 minutes spent, more than 50% of appointment was spent discussing diagnosis and management of symptoms   Kathee DeltonIan D McKeag, MD,MS,  PGY2 01/13/2016 12:31 PM

## 2016-01-13 NOTE — Assessment & Plan Note (Signed)
Patient denies any significant issues or symptoms at this time. Occasional cramping but she denies any effect on daily activities.  - visual assessment of IUD placement yielded visualization of IUD strings in proper placement suggestive of proper device placement. No irritation or friability to cervix. Patient tolerated procedure well and Nurse was present for exam. - F/u in 6 months.

## 2016-01-13 NOTE — Patient Instructions (Signed)
It was a pleasure seeing you today in our clinic. Today we discussed your IUD. Here is the treatment plan we have discussed and agreed upon together:  - The IUD strings were visualized today and appeared to be well placed. As we discussed in the clinic, these strings will continue to get softer with time and should eventually move themselves behind the cervix with time.  - If you have questions or begin to develop any vaginal bleeding, pain, or other issues do not hesitate to call for a follow-up appointment. Otherwise, we'd like to see you back in 6 months.   Intrauterine Device Insertion, Care After Refer to this sheet in the next few weeks. These instructions provide you with information on caring for yourself after your procedure. Your health care provider may also give you more specific instructions. Your treatment has been planned according to current medical practices, but problems sometimes occur. Call your health care provider if you have any problems or questions after your procedure. WHAT TO EXPECT AFTER THE PROCEDURE Insertion of the IUD may cause some discomfort, such as cramping. The cramping should improve after the IUD is in place. You may have bleeding after the procedure. This is normal. It varies from light spotting for a few days to menstrual-like bleeding. When the IUD is in place, a string will extend past the cervix into the vagina for 1-2 inches. The strings should not bother you or your partner. If they do, talk to your health care provider.  HOME CARE INSTRUCTIONS   Check your intrauterine device (IUD) to make sure it is in place before you resume sexual activity. You should be able to feel the strings. If you cannot feel the strings, something may be wrong. The IUD may have fallen out of the uterus, or the uterus may have been punctured (perforated) during placement. Also, if the strings are getting longer, it may mean that the IUD is being forced out of the uterus. You no  longer have full protection from pregnancy if any of these problems occur.  You may resume sexual intercourse if you are not having problems with the IUD. The copper IUD is considered immediately effective, and the hormone IUD works right away if inserted within 7 days of your period starting. You will need to use a backup method of birth control for 7 days if the IUD in inserted at any other time in your cycle.  Continue to check that the IUD is still in place by feeling for the strings after every menstrual period.  You may need to take pain medicine such as acetaminophen or ibuprofen. Only take medicines as directed by your health care provider. SEEK MEDICAL CARE IF:   You have bleeding that is heavier or lasts longer than a normal menstrual cycle.  You have a fever.  You have increasing cramps or abdominal pain not relieved with medicine.  You have abdominal pain that does not seem to be related to the same area of earlier cramping and pain.  You are lightheaded, unusually weak, or faint.  You have abnormal vaginal discharge or smells.  You have pain during sexual intercourse.  You cannot feel the IUD strings, or the IUD string has gotten longer.  You feel the IUD at the opening of the cervix in the vagina.  You think you are pregnant, or you miss your menstrual period.  The IUD string is hurting your sex partner. MAKE SURE YOU:  Understand these instructions.  Will watch your  condition.  Will get help right away if you are not doing well or get worse.   This information is not intended to replace advice given to you by your health care provider. Make sure you discuss any questions you have with your health care provider.   Document Released: 04/06/2011 Document Revised: 05/29/2013 Document Reviewed: 01/27/2013 Elsevier Interactive Patient Education [employer]2016 [employer]

## 2016-03-13 ENCOUNTER — Ambulatory Visit (HOSPITAL_COMMUNITY)
Admission: EM | Admit: 2016-03-13 | Discharge: 2016-03-13 | Disposition: A | Discharge status: Home / Self Care | Payer: Medicaid Other | Attending: Family Medicine | Admitting: Family Medicine

## 2016-03-13 ENCOUNTER — Encounter (HOSPITAL_COMMUNITY): Payer: Self-pay | Admitting: Family Medicine

## 2016-03-13 DIAGNOSIS — N390 Urinary tract infection, site not specified: Secondary | ICD-10-CM

## 2016-03-13 LAB — POCT URINALYSIS DIP (DEVICE)
Bilirubin Urine: NEGATIVE
GLUCOSE, UA: 100 mg/dL — AB
Hgb urine dipstick: NEGATIVE
Ketones, ur: NEGATIVE mg/dL
Nitrite: POSITIVE — AB
Protein, ur: NEGATIVE mg/dL
Specific Gravity, Urine: 1.01 (ref 1.005–1.030)
Urobilinogen, UA: 0.2 mg/dL (ref 0.0–1.0)
pH: 6.5 (ref 5.0–8.0)

## 2016-03-13 LAB — POCT PREGNANCY, URINE: Preg Test, Ur: NEGATIVE

## 2016-03-13 MED ORDER — NITROFURANTOIN MONOHYD MACRO 100 MG PO CAPS: 100.0000 mg | ORAL_CAPSULE | Freq: Two times a day (BID) | ORAL | Status: DC

## 2016-03-13 MED ORDER — NITROFURANTOIN MONOHYD MACRO 100 MG PO CAPS: 100.0000 mg | ORAL_CAPSULE | Freq: Two times a day (BID) | ORAL | 0 refills | Status: AC

## 2016-03-13 NOTE — ED Triage Notes (Signed)
Pt here for urinary frequency and irritation. sts taking AZO without relief.

## 2016-03-13 NOTE — ED Provider Notes (Signed)
CSN: [id number]     Arrival date & time 03/13/16  1521 History   First MD Initiated Contact with Patient 03/13/16 1606     Chief Complaint  Patient presents with  . Urinary Frequency   (Consider location/radiation/quality/duration/timing/severity/associated sxs/prior Treatment) Patient presents today for possible UTI. Symptoms started 2 days ago with dysuria, irritation, urinary frequency.  She denies urinary urgency, abdominal pain back pain or flank pain. Modifying factor: None.     Urinary Frequency     Past Medical History:  Diagnosis Date  . Medical history non-contributory    Past Surgical History:  Procedure Laterality Date  . CESAREAN SECTION N/A 05/08/2013   Procedure: CESAREAN SECTION;  Surgeon: Brock Bad, MD;  Location: [facility];  Service: Obstetrics;  Laterality: N/A;  . NO PAST SURGERIES     History reviewed. No pertinent family history. Social History  Substance Use Topics  . Smoking status: Never Smoker  . Smokeless tobacco: Never Used  . Alcohol use No   OB History    Gravida Para Term Preterm AB Living   0 0 1   SAB TAB Ectopic Multiple Live Births   0   0 0       Review of Systems  Constitutional: Negative for chills and fever.  Respiratory: Negative.   Cardiovascular: Negative.   Genitourinary: Positive for dysuria and frequency. Negative for flank pain and urgency.  Musculoskeletal: Negative for back pain.    Allergies  Review of patient's allergies indicates no known allergies.  Home Medications   Prior to Admission medications   Medication Sig Start Date End Date Taking? Authorizing Provider  nitrofurantoin, macrocrystal-monohydrate, (MACROBID) 100 MG capsule Take 1 capsule (100 mg total) by mouth 2 (two) times daily. 03/13/16 03/18/16  Lucia Estelle, NP  triamcinolone cream (KENALOG) 0.1 % Apply 1 application topically 2 (two) times daily. 09/16/15   Verneda Skill, FNP   Meds Ordered and Administered this Visit   Medications   nitrofurantoin (macrocrystal-monohydrate) (MACROBID) capsule 100 mg (not administered)    BP 117/65   Pulse 80   Temp 98.3 F (36.8 C)   Resp 18   SpO2 98%  No data found.   Physical Exam  Constitutional: She is oriented to person, place, and time. She appears well-developed and well-nourished.  Cardiovascular: Normal rate and regular rhythm.   Pulmonary/Chest: Effort normal and breath sounds normal.  Abdominal: Soft. Bowel sounds are normal. There is no tenderness.  Genitourinary:  Genitourinary Comments: Negative CVA tenderness  Neurological: She is alert and oriented to person, place, and time.    Urgent Care Course   Clinical Course    Procedures (including critical care time)  Labs Review Labs Reviewed  POCT URINALYSIS DIP (DEVICE) - Abnormal; Notable for the following:       Result Value   Glucose, UA 100 (*)    Nitrite POSITIVE (*)    Leukocytes, UA TRACE (*)    All other components within normal limits  URINE CULTURE  POCT PREGNANCY, URINE    Imaging Review No results found.   Visual Acuity Review  Right Eye Distance:   Left Eye Distance:   Bilateral Distance:    Right Eye Near:   Left Eye Near:    Bilateral Near:         MDM   1. UTI (lower urinary tract infection)    Prescriptions for Macrobid BID x 5 days given. Reviewed directions for usage and side effects. Patient states  understanding and will call with questions or problems. Patient instructed to call or follow up with his/her primary care doctor if failure to improve or change in symptoms. Discharge instruction given.      Lucia Estelle, NP 03/13/16 1759

## 2016-03-17 ENCOUNTER — Encounter: Payer: Self-pay | Admitting: Pediatrics

## 2016-05-31 ENCOUNTER — Encounter (HOSPITAL_COMMUNITY): Payer: Self-pay

## 2016-05-31 ENCOUNTER — Emergency Department (HOSPITAL_COMMUNITY)
Admission: EM | Admit: 2016-05-31 | Discharge: 2016-05-31 | Disposition: A | Discharge status: Home / Self Care | Payer: Medicaid Other | Attending: Emergency Medicine | Admitting: Emergency Medicine

## 2016-05-31 DIAGNOSIS — L409 Psoriasis, unspecified: Secondary | ICD-10-CM | POA: Diagnosis not present

## 2016-05-31 DIAGNOSIS — R21 Rash and other nonspecific skin eruption: Secondary | ICD-10-CM | POA: Diagnosis present

## 2016-05-31 HISTORY — DX: Dermatitis, unspecified: L30.9

## 2016-05-31 MED ORDER — HYDROXYZINE HCL 25 MG PO TABS: 25.0000 mg | ORAL_TABLET | Freq: Four times a day (QID) | ORAL | 0 refills | Status: DC

## 2016-05-31 NOTE — ED Notes (Signed)
Patient was alert, oriented and stable upon discharge. RN went over AVS and patient had no further questions.  

## 2016-05-31 NOTE — ED Notes (Signed)
Pt reports that she is complaint with her cream for her eczema.

## 2016-05-31 NOTE — ED Triage Notes (Addendum)
Pt presents with dry scaly areas on her elbows, ankles, hands and feet bilaterally. Pt states that the spots itch and sting when she gets them wet. Pt has a hx of eczema. A&Ox4. No drainage noted.

## 2016-05-31 NOTE — ED Provider Notes (Signed)
[facility]-EMERGENCY DEPT Provider Note   CSN: [id number] Arrival date & time: 05/31/16  4098   By signing my name below, I, Teofilo Pod, attest that this documentation has been prepared under the direction and in the presence of Buel Ream, PA-C. Electronically Signed: Teofilo Pod, ED Scribe. 05/31/2016. 8:43 PM.   History   Chief Complaint Chief Complaint  Patient presents with  . Rash   The history is provided by the patient. No language interpreter was used.   HPI Comments:  Grace Harper is a 20 y.o. female with PMHx of eczema who presents to the Emergency Department complaining of a constant, worsening rash to her elbows, ankles, and bilateral hands and feet x 45 days. Pt describes the rash as burning, stinging, painful and itchy. Pt states that the pain is exacerbated when taking a shower. Pt states that this rash is similar to her previous episodes of eczema, but states that her eczema is usually only on her elbows. Pt has been using triamcinolone cream that was initially helpful, but has not been working recently. Pt denies nausea, vomiting, abdominal pain, urinary symptoms, fever.   Past Medical History:  Diagnosis Date  . Eczema   . Medical history non-contributory     Patient Active Problem List   Diagnosis Date Noted  . IUD check up 01/13/2016  . Prediabetes 09/16/2015  . Hyperlipidemia 09/16/2015  . Weight gain 09/04/2015  . Generalized headaches 09/04/2015  . Teenage mother 06/11/2013  . Contraception management 06/11/2013    Past Surgical History:  Procedure Laterality Date  . CESAREAN SECTION N/A 05/08/2013   Procedure: CESAREAN SECTION;  Surgeon: Brock Bad, MD;  Location: [facility];  Service: Obstetrics;  Laterality: N/A;  . NO PAST SURGERIES      OB History    Gravida Para Term Preterm AB Living   1 1 1  0 0 1   SAB TAB Ectopic Multiple Live Births   0   0 0 1       Home Medications    Prior to Admission medications     Medication Sig Start Date End Date Taking? Authorizing Provider  hydrOXYzine (ATARAX/VISTARIL) 25 MG tablet Take 1 tablet (25 mg total) by mouth every 6 (six) hours. 05/31/16   Emi Holes, PA-C  triamcinolone cream (KENALOG) 0.1 % Apply 1 application topically 2 (two) times daily. 09/16/15   Verneda Skill, FNP    Family History History reviewed. No pertinent family history.  Social History Social History  Substance Use Topics  . Smoking status: Never Smoker  . Smokeless tobacco: Never Used  . Alcohol use No     Allergies   Review of patient's allergies indicates no known allergies.   Review of Systems Review of Systems  Constitutional: Negative for fever.  HENT: Negative for facial swelling.   Respiratory: Negative for shortness of breath.   Cardiovascular: Negative for chest pain.  Gastrointestinal: Negative for abdominal pain, nausea and vomiting.  Genitourinary: Negative for dysuria.  Musculoskeletal: Negative for back pain.  Skin: Positive for rash.     Physical Exam Updated Vital Signs BP 123/70 (BP Location: Left Arm)   Pulse 81   Temp 98.3 F (36.8 C) (Oral)   Resp 16   Ht 5\' 2"  (1.575 m)   Wt 97.7 kg   SpO2 98%   BMI 39.40 kg/m   Physical Exam  Constitutional: She appears well-developed and well-nourished. No distress.  HENT:  Head: Normocephalic and atraumatic.  Mouth/Throat:  Oropharynx is clear and moist. No oropharyngeal exudate.  Eyes: Conjunctivae are normal. Pupils are equal, round, and reactive to light. Right eye exhibits no discharge. Left eye exhibits no discharge. No scleral icterus.  Neck: Normal range of motion. Neck supple. No thyromegaly present.  Cardiovascular: Normal rate, regular rhythm, normal heart sounds and intact distal pulses.  Exam reveals no gallop and no friction rub.   No murmur heard. Pulmonary/Chest: Effort normal and breath sounds normal. No stridor. No respiratory distress. She has no wheezes. She has no rales.   Abdominal: Soft. Bowel sounds are normal. She exhibits no distension. There is no tenderness. There is no rebound and no guarding.  Musculoskeletal: Normal range of motion. She exhibits no edema.  Lymphadenopathy:    She has no cervical adenopathy.  Neurological: She is alert. Coordination normal.  Skin: Skin is warm and dry. No rash noted. She is not diaphoretic. No pallor.  Scaly rash to knees, elbows, bilateral feet and hands; no drainage or other signs of infection see image for more details  Psychiatric: She has a normal mood and affect.  Nursing note and vitals reviewed.                  ED Treatments / Results  DIAGNOSTIC STUDIES:  Oxygen Saturation is 98% on RA, normal by my interpretation.    COORDINATION OF CARE:  8:43 PM Discussed treatment plan with pt at bedside and pt agreed to plan.   Labs (all labs ordered are listed, but only abnormal results are displayed) Labs Reviewed - No data to display  EKG  EKG Interpretation None       Radiology No results found.  Procedures Procedures (including critical care time)  Medications Ordered in ED Medications - No data to display   Initial Impression / Assessment and Plan / ED Course  I have reviewed the triage vital signs and the nursing notes.  Pertinent labs & imaging results that were available during my care of the patient were reviewed by me and considered in my medical decision making (see chart for details).  Clinical Course    Patient with most likely psoriasis. No signs of infection. Patient advised to continue using triamcinolone cream but to follow-up to dermatology. Patient given information to follow up. Patient discharged with Vistaril for itching. Patient understands and agrees with plan. Patient vitals stable throughout ED course and discharged in satisfactory condition. Patient case also discussed with Dr. Jacqulyn BathLong who guided the patient's management and agrees with plan.  Final  Clinical Impressions(s) / ED Diagnoses   Final diagnoses:  Psoriasis    New Prescriptions Discharge Medication List as of 05/31/2016  9:20 PM    START taking these medications   Details  hydrOXYzine (ATARAX/VISTARIL) 25 MG tablet Take 1 tablet (25 mg total) by mouth every 6 (six) hours., Starting Tue 05/31/2016, Print      I personally performed the services described in this documentation, which was scribed in my presence. The recorded information has been reviewed and is accurate.    Emi Holeslexandra M Atreyu Mak, PA-C 06/01/16 1218    Maia PlanJoshua G Long, MD 06/01/16 1736

## 2016-05-31 NOTE — Discharge Instructions (Signed)
Medications: Atarax  Treatment: Take Atarax every 6 hours as needed for itching. Do not drive or operate machinery when taking this medication. Only take as prescribed. Continue to apply triamcinolone cream as prescribed. You can also use moisturizing lotions.  Follow-up: Please follow-up with Dr. Margo AyeHall, a dermatologist, as soon as possible for further evaluation and treatment of your symptoms. Please return to emergency department if you develop any new or worsening symptoms.

## 2016-09-23 ENCOUNTER — Ambulatory Visit (HOSPITAL_COMMUNITY)
Admission: EM | Admit: 2016-09-23 | Discharge: 2016-09-23 | Disposition: A | Discharge status: Home / Self Care | Payer: Medicaid Other | Attending: Family Medicine | Admitting: Family Medicine

## 2016-09-23 ENCOUNTER — Encounter (HOSPITAL_COMMUNITY): Payer: Self-pay | Admitting: Family Medicine

## 2016-09-23 DIAGNOSIS — R110 Nausea: Secondary | ICD-10-CM

## 2016-09-23 DIAGNOSIS — R6889 Other general symptoms and signs: Secondary | ICD-10-CM

## 2016-09-23 DIAGNOSIS — G43A0 Cyclical vomiting, not intractable: Secondary | ICD-10-CM

## 2016-09-23 DIAGNOSIS — R1115 Cyclical vomiting syndrome unrelated to migraine: Secondary | ICD-10-CM

## 2016-09-23 MED ORDER — ONDANSETRON 4 MG PO TBDP: 4.0000 mg | ORAL_TABLET | Freq: Three times a day (TID) | ORAL | 0 refills | Status: DC | PRN

## 2016-09-23 MED ORDER — OSELTAMIVIR PHOSPHATE 75 MG PO CAPS: 75.0000 mg | ORAL_CAPSULE | Freq: Two times a day (BID) | ORAL | 0 refills | Status: DC

## 2016-09-23 NOTE — ED Provider Notes (Signed)
CSN: [id number]     Arrival date & time 09/23/16  1810 History   None    Chief Complaint  Patient presents with  . Abdominal Pain   (Consider location/radiation/quality/duration/timing/severity/associated sxs/prior Treatment) Patient c/o nausea, chills, and weakness for a day.  Patient is feeling lousy.      Past Medical History:  Diagnosis Date  . Eczema   . Medical history non-contributory    Past Surgical History:  Procedure Laterality Date  . CESAREAN SECTION N/A 05/08/2013   Procedure: CESAREAN SECTION;  Surgeon: Brock Bad, MD;  Location: [facility];  Service: Obstetrics;  Laterality: N/A;  . NO PAST SURGERIES     History reviewed. No pertinent family history. Social History  Substance Use Topics  . Smoking status: Never Smoker  . Smokeless tobacco: Never Used  . Alcohol use No   OB History    Gravida Para Term Preterm AB Living   1 1 1  0 0 1   SAB TAB Ectopic Multiple Live Births   0   0 0 1     Review of Systems  Constitutional: Positive for fatigue and fever.  Eyes: Negative.   Respiratory: Negative.   Cardiovascular: Negative.   Gastrointestinal: Positive for nausea.  Endocrine: Negative.   Genitourinary: Negative.   Musculoskeletal: Negative.   Skin: Negative.   Allergic/Immunologic: Negative.   Neurological: Negative.   Hematological: Negative.   Psychiatric/Behavioral: Negative.     Allergies  Patient has no known allergies.  Home Medications   Prior to Admission medications   Medication Sig Start Date End Date Taking? Authorizing Provider  hydrOXYzine (ATARAX/VISTARIL) 25 MG tablet Take 1 tablet (25 mg total) by mouth every 6 (six) hours. 05/31/16   Alexandra M Law, PA-C  ondansetron (ZOFRAN ODT) 4 MG disintegrating tablet Take 1 tablet (4 mg total) by mouth every 8 (eight) hours as needed for nausea or vomiting. 09/23/16   Deatra Canter, FNP  oseltamivir (TAMIFLU) 75 MG capsule Take 1 capsule (75 mg total) by mouth every 12 (twelve)  hours. 09/23/16   Deatra Canter, FNP  triamcinolone cream (KENALOG) 0.1 % Apply 1 application topically 2 (two) times daily. 09/16/15   Verneda Skill, FNP   Meds Ordered and Administered this Visit  Medications - No data to display  BP (!) 98/35   Pulse 79   Temp 98.9 F (37.2 C)   Resp 18   LMP 08/26/2016   SpO2 99%  No data found.   Physical Exam  Constitutional: She appears well-developed and well-nourished.  HENT:  Head: Normocephalic and atraumatic.  Right Ear: External ear normal.  Left Ear: External ear normal.  Mouth/Throat: Oropharynx is clear and moist.  Eyes: Conjunctivae and EOM are normal. Pupils are equal, round, and reactive to light.  Neck: Normal range of motion. Neck supple.  Cardiovascular: Normal rate, regular rhythm and normal heart sounds.   Pulmonary/Chest: Effort normal and breath sounds normal.  Abdominal: Soft. Bowel sounds are normal.  Nursing note and vitals reviewed.   Urgent Care Course     Procedures (including critical care time)  Labs Review Labs Reviewed - No data to display  Imaging Review No results found.   Visual Acuity Review  Right Eye Distance:   Left Eye Distance:   Bilateral Distance:    Right Eye Near:   Left Eye Near:    Bilateral Near:         MDM   1. Flu-like symptoms   2. Non-intractable  cyclical vomiting without nausea   3. Nausea    zofran Tamiflu    Deatra CanterWilliam J Oxford, FNP 09/23/16 2006

## 2016-09-23 NOTE — ED Triage Notes (Signed)
Pt here for abd cramping and chills. Denies any N,V,D.

## 2016-11-10 ENCOUNTER — Encounter (HOSPITAL_COMMUNITY): Payer: Self-pay

## 2016-11-10 ENCOUNTER — Emergency Department (HOSPITAL_COMMUNITY)
Admission: EM | Admit: 2016-11-10 | Discharge: 2016-11-10 | Disposition: A | Discharge status: Home / Self Care | Payer: Medicaid Other | Attending: Emergency Medicine | Admitting: Emergency Medicine

## 2016-11-10 DIAGNOSIS — Z79899 Other long term (current) drug therapy: Secondary | ICD-10-CM | POA: Insufficient documentation

## 2016-11-10 DIAGNOSIS — M7521 Bicipital tendinitis, right shoulder: Secondary | ICD-10-CM | POA: Insufficient documentation

## 2016-11-10 MED ORDER — ACETAMINOPHEN 500 MG PO TABS
1000.0000 mg | ORAL_TABLET | Freq: Once | ORAL | Status: AC
  Administered 2016-11-10: 1000 mg via ORAL
  Filled 2016-11-10: qty 2

## 2016-11-10 MED ORDER — IBUPROFEN 800 MG PO TABS
800.0000 mg | ORAL_TABLET | Freq: Once | ORAL | Status: AC
  Administered 2016-11-10: 800 mg via ORAL
  Filled 2016-11-10: qty 1

## 2016-11-10 MED ORDER — OXYCODONE HCL 5 MG PO TABS
5.0000 mg | ORAL_TABLET | Freq: Once | ORAL | Status: AC
  Administered 2016-11-10: 5 mg via ORAL
  Filled 2016-11-10: qty 1

## 2016-11-10 NOTE — ED Notes (Signed)
ED Provider at bedside. 

## 2016-11-10 NOTE — Discharge Instructions (Signed)
Take 4 over the counter ibuprofen tablets 3 times a day or 2 over-the-counter naproxen tablets twice a day for pain. Also take tylenol 1000mg(2 extra strength) four times a day.    

## 2016-11-10 NOTE — ED Provider Notes (Signed)
[facility]-EMER[facility] DEPT Provider Note   CSN: [id number] Arrival date & time: 11/10/16  0700     History   Chief Complaint Chief Complaint  Patient presents with  . Shoulder Pain    Right     HPI Grace Harper is a 21 y.o. female.  21 yo F with a chief complaint of right shoulder pain. Going on for the past 4 days. Patient denies any overt injury. 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 Pain with shoulder flexion as well as elbow flexion. She feels that the pain radiates from the front of her shoulder down the biceps. Denies any numbness or tingling.   The history is provided by the patient.  Shoulder Pain   This is a new problem. The current episode started more than 2 days ago. The problem occurs constantly. The problem has not changed since onset.The pain is present in the right shoulder. The quality of the pain is described as aching and sharp. The pain is at a severity of 9/10. The pain is severe. Associated symptoms include limited range of motion. She has tried nothing for the symptoms. The treatment provided no relief. There has been no history of extremity trauma.    Past Medical History:  Diagnosis Date  . Eczema   . Medical history non-contributory     Patient Active Problem List   Diagnosis Date Noted  . IUD check up 01/13/2016  . Prediabetes 09/16/2015  . Hyperlipidemia 09/16/2015  . Weight gain 09/04/2015  . Generalized headaches 09/04/2015  . Teenage mother 06/11/2013  . Contraception management 06/11/2013    Past Surgical History:  Procedure Laterality Date  . CESAREAN SECTION N/A 05/08/2013   Procedure: CESAREAN SECTION;  Surgeon: Brock Badharles A Harper, MD;  Location: [facility];  Service: Obstetrics;  Laterality: N/A;  . NO PAST SURGERIES      OB History    Gravida Para Term Preterm AB Living   1 1 1  0 0 1   SAB TAB Ectopic Multiple Live Births   0   0 0 1       Home Medications    Prior to Admission medications   Medication Sig Start Date End Date  Taking? Authorizing Provider  hydrOXYzine (ATARAX/VISTARIL) 25 MG tablet Take 1 tablet (25 mg total) by mouth every 6 (six) hours. 05/31/16   Alexandra M Law, PA-C  ondansetron (ZOFRAN ODT) 4 MG disintegrating tablet Take 1 tablet (4 mg total) by mouth every 8 (eight) hours as needed for nausea or vomiting. 09/23/16   Deatra CanterWilliam J Oxford, FNP  oseltamivir (TAMIFLU) 75 MG capsule Take 1 capsule (75 mg total) by mouth every 12 (twelve) hours. 09/23/16   Deatra CanterWilliam J Oxford, FNP  triamcinolone cream (KENALOG) 0.1 % Apply 1 application topically 2 (two) times daily. 09/16/15   Verneda Skillaroline T Hacker, FNP    Family History History reviewed. No pertinent family history.  Social History Social History  Substance Use Topics  . Smoking status: Never Smoker  . Smokeless tobacco: Never Used  . Alcohol use No     Allergies   Patient has no known allergies.   Review of Systems Review of Systems  Constitutional: Negative for chills and fever.  HENT: Negative for congestion and rhinorrhea.   Eyes: Negative for redness and visual disturbance.  Respiratory: Negative for shortness of breath and wheezing.   Cardiovascular: Negative for chest pain and palpitations.  Gastrointestinal: Negative for nausea and vomiting.  Genitourinary: Negative for dysuria and urgency.  Musculoskeletal: Positive for arthralgias and myalgias.  Skin: Negative for pallor and wound.  Neurological: Negative for dizziness and headaches.     Physical Exam Updated Vital Signs BP (!) 112/50 (BP Location: Left Arm)   Pulse 93   Temp 97.6 F (36.4 C) (Oral)   Resp 18   Ht 5\' 2"  (1.575 m)   Wt 215 lb 8 oz (97.8 kg)   SpO2 99%   BMI 39.42 kg/m   Physical Exam  Constitutional: She is oriented to person, place, and time. She appears well-developed and well-nourished. No distress.  HENT:  Head: Normocephalic and atraumatic.  Eyes: EOM are normal. Pupils are equal, round, and reactive to light.  Neck: Normal range of motion. Neck  supple.  Cardiovascular: Normal rate and regular rhythm.  Exam reveals no gallop and no friction rub.   No murmur heard. Pulmonary/Chest: Effort normal. She has no wheezes. She has no rales.  Abdominal: Soft. She exhibits no distension. There is no tenderness.  Musculoskeletal: She exhibits tenderness (tender palpation diffusely about the biceps as well as at the biceps groove. ). She exhibits no edema.  Also noted and sensation is intact distally. Unable to perform SITS exam due to pain.  Neurological: She is alert and oriented to person, place, and time.  Skin: Skin is warm and dry. She is not diaphoretic.  Psychiatric: She has a normal mood and affect. Her behavior is normal.  Nursing note and vitals reviewed.    ED Treatments / Results  Labs (all labs ordered are listed, but only abnormal results are displayed) Labs Reviewed - No data to display  EKG  EKG Interpretation None       Radiology No results found.  Procedures Procedures (including critical care time)  Medications Ordered in ED Medications  acetaminophen (TYLENOL) tablet 1,000 mg (not administered)  ibuprofen (ADVIL,MOTRIN) tablet 800 mg (not administered)  oxyCODONE (Oxy IR/ROXICODONE) immediate release tablet 5 mg (not administered)     Initial Impression / Assessment and Plan / ED Course  I have reviewed the triage vital signs and the nursing notes.  Pertinent labs & imaging results that were available during my care of the patient were reviewed by me and considered in my medical decision making (see chart for details).     21 yo F With right shoulder pain. Most tenderness in the biceps groove. Unable to test sits due to pain. We'll place in sling. Tylenol and NSAIDs. PCP or orthopedic follow-up.  7:49 AM:  I have discussed the diagnosis/risks/treatment options with the patient and family and believe the pt to be eligible for discharge home to follow-up with PCP. We also discussed returning to the ED  immediately if new or worsening sx occur. We discussed the sx which are most concerning (e.g., sudden worsening pain, fever, inability to tolerate by mouth) that necessitate immediate return. Medications administered to the patient during their visit and any new prescriptions provided to the patient are listed below.  Medications given during this visit Medications  acetaminophen (TYLENOL) tablet 1,000 mg (not administered)  ibuprofen (ADVIL,MOTRIN) tablet 800 mg (not administered)  oxyCODONE (Oxy IR/ROXICODONE) immediate release tablet 5 mg (not administered)     The patient appears reasonably screen and/or stabilized for discharge and I doubt any other medical condition or other [facility] requiring further screening, evaluation, or treatment in the ED at this time prior to discharge.    Final Clinical Impressions(s) / ED Diagnoses   Final diagnoses:  Biceps tendinitis of right upper  extremity    New Prescriptions New Prescriptions   No medications on file     Melene Plan, DO 11/10/16 1610

## 2016-11-10 NOTE — ED Triage Notes (Signed)
Pt reports 10/10 right sided shoulder pain that started Tuesday evening. Shoulder pain is reproducible upon palpation and movement. Pt denies cp. Pt denies heavy lifting. Pt denies recent fall. Pt A+OX4, speaking in complete sentences, ambulatory to triage.

## 2016-11-10 NOTE — ED Notes (Signed)
Sling intact

## 2017-08-22 ENCOUNTER — Encounter (HOSPITAL_COMMUNITY): Payer: Self-pay | Admitting: Nurse Practitioner

## 2017-08-22 ENCOUNTER — Emergency Department (HOSPITAL_COMMUNITY)
Admission: EM | Admit: 2017-08-22 | Discharge: 2017-08-22 | Disposition: A | Discharge status: Home / Self Care | Payer: Self-pay | Attending: Emergency Medicine | Admitting: Emergency Medicine

## 2017-08-22 DIAGNOSIS — Z79899 Other long term (current) drug therapy: Secondary | ICD-10-CM | POA: Insufficient documentation

## 2017-08-22 DIAGNOSIS — J069 Acute upper respiratory infection, unspecified: Secondary | ICD-10-CM | POA: Insufficient documentation

## 2017-08-22 DIAGNOSIS — R05 Cough: Secondary | ICD-10-CM | POA: Insufficient documentation

## 2017-08-22 DIAGNOSIS — J029 Acute pharyngitis, unspecified: Secondary | ICD-10-CM | POA: Insufficient documentation

## 2017-08-22 DIAGNOSIS — R059 Cough, unspecified: Secondary | ICD-10-CM

## 2017-08-22 LAB — RAPID STREP SCREEN (MED CTR MEBANE ONLY): STREPTOCOCCUS, GROUP A SCREEN (DIRECT): NEGATIVE

## 2017-08-22 MED ORDER — FLUTICASONE PROPIONATE 50 MCG/ACT NA SUSP: 1.0000 | Freq: Every day | NASAL | 2 refills | Status: DC

## 2017-08-22 MED ORDER — BENZONATATE 100 MG PO CAPS
100.0000 mg | ORAL_CAPSULE | Freq: Three times a day (TID) | ORAL | 0 refills | Status: DC
Start: 2017-08-22 — End: 2020-02-10

## 2017-08-22 NOTE — ED Provider Notes (Signed)
[facility]-EMERGENCY DEPT Provider Note   CSN: [id number] Arrival date & time: 08/22/17  0149     History   Chief Complaint Chief Complaint  Patient presents with  . Sore Throat    HPI Grace Harper is a 22 y.o. female resents for evaluation of 3 days of nasal congestion, rhinorrhea, cough, sore throat.  Patient reports that cough is productive of phlegm.  Today she noticed some mucus with blood-tinged streaks, prompting concern.  Patient reports that she is having some postnasal drip.  She has not taking any medications for the symptoms.  Patient reports that she had one episode of vomiting today but has otherwise been able to tolerate p.o. without any difficulty.  Patient denies any fevers, chest pain, difficulty breathing, dysuria, hematuria, difficulty swallowing, vomiting.  The history is provided by the patient.    Past Medical History:  Diagnosis Date  . Eczema   . Medical history non-contributory     Patient Active Problem List   Diagnosis Date Noted  . IUD check up 01/13/2016  . Prediabetes 09/16/2015  . Hyperlipidemia 09/16/2015  . Weight gain 09/04/2015  . Generalized headaches 09/04/2015  . Teenage mother 06/11/2013  . Contraception management 06/11/2013    Past Surgical History:  Procedure Laterality Date  . CESAREAN SECTION N/A 05/08/2013   Procedure: CESAREAN SECTION;  Surgeon: Brock Bad, MD;  Location: [facility];  Service: Obstetrics;  Laterality: N/A;  . NO PAST SURGERIES      OB History    Gravida Para Term Preterm AB Living   1 1 1  0 0 1   SAB TAB Ectopic Multiple Live Births   0   0 0 1       Home Medications    Prior to Admission medications   Medication Sig Start Date End Date Taking? Authorizing Provider  ibuprofen (ADVIL,MOTRIN) 200 MG tablet Take 400 mg by mouth every 6 (six) hours as needed for moderate pain.   Yes [provider]  benzonatate (TESSALON) 100 MG capsule Take 1 capsule (100 mg  total) by mouth every 8 (eight) hours. 08/22/17   Maxwell Caul, PA-C  fluticasone (FLONASE) 50 MCG/ACT nasal spray Place 1 spray into both nostrils daily. 08/22/17   Maxwell Caul, PA-C  hydrOXYzine (ATARAX/VISTARIL) 25 MG tablet Take 1 tablet (25 mg total) by mouth every 6 (six) hours. Patient not taking: Reported on 08/22/2017 05/31/16   Emi Holes, PA-C  ondansetron (ZOFRAN ODT) 4 MG disintegrating tablet Take 1 tablet (4 mg total) by mouth every 8 (eight) hours as needed for nausea or vomiting. Patient not taking: Reported on 08/22/2017 09/23/16   Deatra Canter, FNP  oseltamivir (TAMIFLU) 75 MG capsule Take 1 capsule (75 mg total) by mouth every 12 (twelve) hours. Patient not taking: Reported on 08/22/2017 09/23/16   Deatra Canter, FNP  triamcinolone cream (KENALOG) 0.1 % Apply 1 application topically 2 (two) times daily. Patient not taking: Reported on 08/22/2017 09/16/15   Verneda Skill, FNP    Family History History reviewed. No pertinent family history.  Social History Social History   Tobacco Use  . Smoking status: Never Smoker  . Smokeless tobacco: Never Used  Substance Use Topics  . Alcohol use: No    Alcohol/week: 0.0 oz  . Drug use: No     Allergies   Patient has no known allergies.   Review of Systems Review of Systems  Constitutional: Negative for fever.  HENT: Positive  for congestion and sore throat. Negative for trouble swallowing.   Respiratory: Positive for cough. Negative for shortness of breath.   Cardiovascular: Negative for chest pain.  Gastrointestinal: Negative for abdominal pain, nausea and vomiting.  Genitourinary: Negative for dysuria and hematuria.  Neurological: Negative for headaches.     Physical Exam Updated Vital Signs BP 127/79 (BP Location: Left Arm)   Pulse 81   Temp 97.9 F (36.6 C) (Oral)   Resp 18   SpO2 100%   Physical Exam  Constitutional: She is oriented to person, place, and time. She appears well-developed  and well-nourished.  HENT:  Head: Normocephalic and atraumatic.  Nose: Mucosal edema and rhinorrhea present.  Mouth/Throat: Uvula is midline and mucous membranes are normal. No trismus in the jaw. Posterior oropharyngeal erythema present.  Airway is patent, phonation is normal.  Uvula is midline.  No evidence.  Posterior oropharynx is slightly erythematous but no evidence of exudates or edema.  No evidence of peritonsillar abscess.  No facial or neck swelling.  Eyes: Conjunctivae, EOM and lids are normal. Pupils are equal, round, and reactive to light.  Neck: Full passive range of motion without pain.  Cardiovascular: Normal rate, regular rhythm, normal heart sounds and normal pulses. Exam reveals no gallop and no friction rub.  No murmur heard. Pulmonary/Chest: Effort normal and breath sounds normal.  No evidence of respiratory distress. Able to speak in full sentences without difficulty.  Abdominal: Soft. Normal appearance. There is no tenderness. There is no rigidity and no guarding.  Musculoskeletal: Normal range of motion.  Neurological: She is alert and oriented to person, place, and time.  Skin: Skin is warm and dry. Capillary refill takes less than 2 seconds.  Psychiatric: She has a normal mood and affect. Her speech is normal.  Nursing note and vitals reviewed.    ED Treatments / Results  Labs (all labs ordered are listed, but only abnormal results are displayed) Labs Reviewed  RAPID STREP SCREEN (NOT AT [facility])  CULTURE, GROUP A STREP [facility])    EKG  EKG Interpretation None       Radiology No results found.  Procedures Procedures (including critical care time)  Medications Ordered in ED Medications - No data to display   Initial Impression / Assessment and Plan / ED Course  I have reviewed the triage vital signs and the nursing notes.  Pertinent labs & imaging results that were available during my care of the patient were reviewed by me and considered in my  medical decision making (see chart for details).     22 y.o. female who presents for evaluation of 3 days of nasal congestion, cough, rhinorrhea and sore throat.  Patient does not take any medications for the symptoms. Patient is afebrile, non-toxic appearing, sitting comfortably on examination table. Vital signs reviewed and stable.  Physical exam shows some erythema to the posterior oropharynx.  No evidence of respiratory distress.  O2 sats are 100% on room air.  Consider viral pharyngitis versus upper respiratory infection.  History/physical exam not concerning for pneumonia, Ludwig angina, peritonsillar abscess.  Rapid strep ordered at triage.  Rapid strep is negative.  Discussed results with patient.  Plan for symptom medic conservative therapies at home.  Instructed patient to follow-up with primary care doctor in the next 2-4 days for further evaluation. Patient had ample opportunity for questions and discussion. All patient's questions were answered with full understanding. Strict return precautions discussed. Patient expresses understanding and agreement to plan.    Final  Clinical Impressions(s) / ED Diagnoses   Final diagnoses:  Viral upper respiratory tract infection  Cough    ED Discharge Orders        Ordered    benzonatate (TESSALON) 100 MG capsule  Every 8 hours     08/22/17 0403    fluticasone (FLONASE) 50 MCG/ACT nasal spray  Daily     08/22/17 0403       Maxwell Caul, PA-C 08/22/17 0810    Azalia Bilis, MD 08/22/17 [phone]

## 2017-08-22 NOTE — ED Notes (Signed)
Bed: WA02 Expected date:  Expected time:  Means of arrival:  Comments: 

## 2017-08-22 NOTE — ED Triage Notes (Signed)
Pt is c/o sore throat describing it as a burning sensation. Mild swelling noticeable in the peritonsillar aspect of her mouth.

## 2017-08-22 NOTE — Discharge Instructions (Signed)
You take [employer] Perles as directed.  You can use the flonase For congestion.  Make sure you are drinking plenty of fluids and staying hydrated.  You can take Tylenol or Ibuprofen as directed for pain. You can alternate Tylenol and Ibuprofen every 4 hours. If you take Tylenol at 1pm, then you can take Ibuprofen at 5pm. Then you can take Tylenol again at 9pm.   Follow-up with your primary care doctor for further evaluation.  Return emergency department for any fever, vomiting, chest pain, difficulty breathing or any other worsening or concerning symptoms.

## 2017-08-24 LAB — CULTURE, GROUP A STREP (THRC)

## 2017-12-12 ENCOUNTER — Encounter (HOSPITAL_COMMUNITY): Payer: Self-pay | Admitting: Emergency Medicine

## 2017-12-12 ENCOUNTER — Emergency Department (HOSPITAL_COMMUNITY)
Admission: EM | Admit: 2017-12-12 | Discharge: 2017-12-12 | Disposition: A | Discharge status: Home / Self Care | Payer: Self-pay | Attending: Emergency Medicine | Admitting: Emergency Medicine

## 2017-12-12 DIAGNOSIS — Z79899 Other long term (current) drug therapy: Secondary | ICD-10-CM | POA: Insufficient documentation

## 2017-12-12 DIAGNOSIS — K047 Periapical abscess without sinus: Secondary | ICD-10-CM | POA: Insufficient documentation

## 2017-12-12 LAB — POC URINE PREG, ED: Preg Test, Ur: NEGATIVE

## 2017-12-12 MED ORDER — AMOXICILLIN-POT CLAVULANATE 875-125 MG PO TABS: 1.0000 | ORAL_TABLET | Freq: Two times a day (BID) | ORAL | 0 refills | Status: AC

## 2017-12-12 MED ORDER — AMOXICILLIN-POT CLAVULANATE 875-125 MG PO TABS
1.0000 | ORAL_TABLET | Freq: Once | ORAL | Status: AC
  Administered 2017-12-12: 1 via ORAL
  Filled 2017-12-12: qty 1

## 2017-12-12 NOTE — ED Triage Notes (Signed)
Pt reports left sided lower dental abcess that came up yesterday. Went to a cheap dentist today to get tooth pulled but told to swollen to pull. Had xray done and given prescription of antibiotics.

## 2017-12-12 NOTE — Discharge Instructions (Signed)
Please see the information and instructions below regarding your visit.  Your diagnoses today include:  1. Dental infection    You have a dental infection. It is very important that you get evaluated by a dentist as soon as possible. Call tomorrow to schedule an appointment. Take your full course of antibiotics. Read the instructions below.  Tests performed today include: See side panel of your discharge paperwork for testing performed today. Vital signs are listed at the bottom of these instructions.   Medications prescribed:    Take any prescribed medications only as prescribed, and any over the counter medications only as directed on the packaging.  1. You are prescribed Augmentin, an antibiotic. Please take all of your antibiotics until finished.   You may develop abdominal discomfort or nausea from the antibiotic. If this occurs, you may take it with food. Some patients also get diarrhea with antibiotics. You may help offset this with probiotics which you can buy or get in yogurt. Do not eat or take the probiotics until 2 hours after your antibiotic. Some women develop vaginal yeast infections after antibiotics. If you develop unusual vaginal discharge after being on this medication, please see your primary care provider.   Some people develop allergies to antibiotics. Symptoms of antibiotic allergy can be mild and include a flat rash and itching. They can also be more serious and include:  ?Hives - Hives are raised, red patches of skin that are usually very itchy.  ?Lip or tongue swelling  ?Trouble swallowing or breathing  ?Blistering of the skin or mouth.  If you have any of these serious symptoms, please seek emergency medical care immediately.  Home care instructions:  Please follow any educational materials contained in this packet.   Eat a soft or liquid diet and rinse your mouth out after meals with warm water. You should see a dentist or return here at once if you have  increased swelling, increased pain or uncontrolled bleeding from the site of your injury.  Follow-up instructions: It is very important that you see a dentist as soon as possible. There is a list of dentists attached to this packet if you do not have care established with a dentist already. Please give a call to a dentist of your choice tomorrow.  Return instructions:  Please return to the Emergency Department if you experience worsening symptoms.  Please seek care if you note any of the following about your dental pain:  You have increased pain not controlled with medicines.  You have swelling around your tooth, in your face or neck.  You have bleeding which starts, continues, or gets worse.  You have a fever >101 If you are unable to open your mouth Please return if you have any other emergent concerns.  Additional Information:   Your vital signs today were: BP (!) 143/75 (BP Location: Left Arm)    Pulse 94    Temp 99 F (37.2 C) (Oral)    Resp 18    LMP 12/11/2017    SpO2 100%  If your blood pressure (BP) was elevated on multiple readings during this visit above 130 for the top number or above 80 for the bottom number, please have this repeated by your primary care provider within one month. --------------  Thank you for allowing us to participate in your care today.

## 2017-12-12 NOTE — ED Provider Notes (Signed)
[facility]-EMERGENCY DEPT Provider Note   CSN: [id number] Arrival date & time: 12/12/17  1650     History   Chief Complaint Chief Complaint  Patient presents with  . Oral Swelling    HPI Grace Harper is a 22 y.o. female.  HPI   Grace Harper is a 22 y.o. female who presents to the Emergency Department complaining of persistent, gradually worsening, left-sided, lower dental pain beginning 2 days ago. Pt reports that she noted swelling rapidly on the lateral aspect of her left jawbone that came after persistent left lower dental pain.  Patient reports she presented to  a dentist she recently establish care with, but they were unable to pull the tooth due to the swelling.  Patient reports he was placed on amoxicillin, and 2 doses a day, however she feels that she has had progressive swelling and wanted to rule out dental abscess.  Pt denies fever, chills, difficulty breathing, difficulty swallowing.  Patient has been taking Tylenol with codeine for relief prescribed per dentist.  Past Medical History:  Diagnosis Date  . Eczema   . Medical history non-contributory     Patient Active Problem List   Diagnosis Date Noted  . IUD check up 01/13/2016  . Prediabetes 09/16/2015  . Hyperlipidemia 09/16/2015  . Weight gain 09/04/2015  . Generalized headaches 09/04/2015  . Teenage mother 06/11/2013  . Contraception management 06/11/2013    Past Surgical History:  Procedure Laterality Date  . CESAREAN SECTION N/A 05/08/2013   Procedure: CESAREAN SECTION;  Surgeon: Brock Bad, MD;  Location: [facility];  Service: Obstetrics;  Laterality: N/A;  . NO PAST SURGERIES       OB History    Gravida  1   Para  1   Term  1   Preterm  0   AB  0   Living  1     SAB  0   TAB      Ectopic  0   Multiple  0   Live Births  1            Home Medications    Prior to Admission medications   Medication Sig Start Date End Date Taking? Authorizing  Provider  amoxicillin-clavulanate (AUGMENTIN) 875-125 MG tablet Take 1 tablet by mouth every 12 (twelve) hours for 7 days. You were given one dose in the ED. 12/12/17 12/19/17  Aviva Kluver B, PA-C  benzonatate (TESSALON) 100 MG capsule Take 1 capsule (100 mg total) by mouth every 8 (eight) hours. 08/22/17   Maxwell Caul, PA-C  fluticasone (FLONASE) 50 MCG/ACT nasal spray Place 1 spray into both nostrils daily. 08/22/17   Maxwell Caul, PA-C  hydrOXYzine (ATARAX/VISTARIL) 25 MG tablet Take 1 tablet (25 mg total) by mouth every 6 (six) hours. Patient not taking: Reported on 08/22/2017 05/31/16   Emi Holes, PA-C  ibuprofen (ADVIL,MOTRIN) 200 MG tablet Take 400 mg by mouth every 6 (six) hours as needed for moderate pain.    [provider]  ondansetron (ZOFRAN ODT) 4 MG disintegrating tablet Take 1 tablet (4 mg total) by mouth every 8 (eight) hours as needed for nausea or vomiting. Patient not taking: Reported on 08/22/2017 09/23/16   Deatra Canter, FNP  oseltamivir (TAMIFLU) 75 MG capsule Take 1 capsule (75 mg total) by mouth every 12 (twelve) hours. Patient not taking: Reported on 08/22/2017 09/23/16   Deatra Canter, FNP  triamcinolone cream (KENALOG) 0.1 % Apply 1 application  topically 2 (two) times daily. Patient not taking: Reported on 08/22/2017 09/16/15   Verneda Skill, FNP    Family History No family history on file.  Social History Social History   Tobacco Use  . Smoking status: Never Smoker  . Smokeless tobacco: Never Used  Substance Use Topics  . Alcohol use: No    Alcohol/week: 0.0 oz  . Drug use: No     Allergies   Patient has no known allergies.   Review of Systems Review of Systems  Constitutional: Negative for chills and fever.  HENT: Positive for dental problem. Negative for trouble swallowing and voice change.   Respiratory: Negative for shortness of breath and stridor.      Physical Exam Updated Vital Signs BP (!) 146/78   Pulse 81    Temp 99 F (37.2 C) (Oral)   Resp 16   LMP 12/11/2017   SpO2 100%   Physical Exam  Constitutional: She appears well-developed and well-nourished. No distress.  Sitting comfortably in bed.  HENT:  Head: Normocephalic and atraumatic.  Dental cavities and poor oral dentition noted.  Left lower first molar as depicted in image. No clear area of fluctuance noted in the periapical region.  There is indurated swelling extending from the left mandible.  Midline uvula. No trismus. OP clear and moist. No oropharyngeal erythema or edema. Neck supple with no tenderness.  Eyes: Conjunctivae are normal. Right eye exhibits no discharge. Left eye exhibits no discharge.  EOMs normal to gross examination.  Neck: Normal range of motion.  Pulmonary/Chest:  Normal respiratory effort. Patient converses comfortably. No audible wheeze or stridor.  Abdominal: She exhibits no distension.  Musculoskeletal: Normal range of motion.  Neurological: She is alert.  Cranial nerves intact to gross observation. Patient moves extremities without difficulty.  Skin: Skin is warm and dry. She is not diaphoretic.  Psychiatric: She has a normal mood and affect. Her behavior is normal. Judgment and thought content normal.  Nursing note and vitals reviewed.    ED Treatments / Results  Labs (all labs ordered are listed, but only abnormal results are displayed) Labs Reviewed  POC URINE PREG, ED    EKG None  Radiology No results found.  Procedures Procedures (including critical care time)  Medications Ordered in ED Medications  amoxicillin-clavulanate (AUGMENTIN) 875-125 MG per tablet 1 tablet (1 tablet Oral Given 12/12/17 2229)     Initial Impression / Assessment and Plan / ED Course  I have reviewed the triage vital signs and the nursing notes.  Pertinent labs & imaging results that were available during my care of the patient were reviewed by me and considered in my medical decision making (see chart for  details).     Grace Harper is a 22 y.o. female who presents to ED for dental pain. No abscess requiring immediate incision and drainage, however there is facial swelling. Patient is afebrile, non toxic appearing, and swallowing secretions well. Exam not concerning for Ludwig's angina or pharyngeal abscess.  Since patient noted some swelling increasing on amoxicillin, I discussed that switching to Augmentin is reasonable.  Patient has a 2-day follow-up with the dentist to evaluated her today.  Patient instructed to return for any difficulty breathing, difficulty swallowing, or persistent facial swelling while on Augmentin therapy.  Patient voices understanding and is agreeable to plan.   Final Clinical Impressions(s) / ED Diagnoses   Final diagnoses:  Dental infection    ED Discharge Orders  Ordered    amoxicillin-clavulanate (AUGMENTIN) 875-125 MG tablet  Every 12 hours     12/12/17 2223       Delia ChimesMurray, Abria Vannostrand B, PA-C 12/12/17 2348    Shaune PollackIsaacs, Cameron, MD 12/14/17 [phone]

## 2018-03-03 ENCOUNTER — Emergency Department (HOSPITAL_COMMUNITY)
Admission: EM | Admit: 2018-03-03 | Discharge: 2018-03-03 | Disposition: A | Discharge status: Home / Self Care | Payer: Self-pay | Attending: Emergency Medicine | Admitting: Emergency Medicine

## 2018-03-03 ENCOUNTER — Encounter (HOSPITAL_COMMUNITY): Payer: Self-pay | Admitting: Emergency Medicine

## 2018-03-03 DIAGNOSIS — L509 Urticaria, unspecified: Secondary | ICD-10-CM | POA: Insufficient documentation

## 2018-03-03 DIAGNOSIS — Z79899 Other long term (current) drug therapy: Secondary | ICD-10-CM | POA: Insufficient documentation

## 2018-03-03 MED ORDER — DIPHENHYDRAMINE HCL 25 MG PO CAPS
50.0000 mg | ORAL_CAPSULE | Freq: Once | ORAL | Status: AC
  Administered 2018-03-03: 50 mg via ORAL
  Filled 2018-03-03: qty 2

## 2018-03-03 MED ORDER — DIPHENHYDRAMINE HCL 25 MG PO TABS: 50.0000 mg | ORAL_TABLET | ORAL | 0 refills | Status: DC | PRN

## 2018-03-03 NOTE — ED Triage Notes (Signed)
Patient here from home with complaints of bilateral leg rash. Red, itchy, unsure of cause.

## 2018-03-03 NOTE — ED Provider Notes (Signed)
[facility]-EMERGENCY DEPT Provider Note   CSN: [id number] Arrival date & time: 03/03/18  1431     History   Chief Complaint Chief Complaint  Patient presents with  . Rash    HPI Grace [facility] is a 22 y.o. female.  HPI  22 year old female presents with rash.  Started yesterday.  2 days ago she started having a little bit of itching to her right anterior shin but no injuries or obvious bites.  Yesterday started having a rash in her lower extremities.  It is in her lower 70s, mostly thighs but also in her buttocks.  Nothing above her buttocks.  No shortness of breath, fever, sore throat or trouble speaking.  No lip or tongue swelling.  She does not know what could have caused this and denies any recent new foods, detergents, medicines, animals, etc.  Tried 50 mg of an anti-allergy pill that she got over-the-counter yesterday.  The rash itches and burns.  She tried hydrocortisone cream without relief.  Past Medical History:  Diagnosis Date  . Eczema   . Medical history non-contributory     Patient Active Problem List   Diagnosis Date Noted  . IUD check up 01/13/2016  . Prediabetes 09/16/2015  . Hyperlipidemia 09/16/2015  . Weight gain 09/04/2015  . Generalized headaches 09/04/2015  . Teenage mother 06/11/2013  . Contraception management 06/11/2013    Past Surgical History:  Procedure Laterality Date  . CESAREAN SECTION N/A 05/08/2013   Procedure: CESAREAN SECTION;  Surgeon: Brock Bad, MD;  Location: [facility];  Service: Obstetrics;  Laterality: N/A;  . NO PAST SURGERIES       OB History    Gravida  1   Para  1   Term  1   Preterm  0   AB  0   Living  1     SAB  0   TAB      Ectopic  0   Multiple  0   Live Births  1            Home Medications    Prior to Admission medications   Medication Sig Start Date End Date Taking? Authorizing Provider  Hydrocortisone-Aloe 0.5 % CREA Apply 1 application topically daily as  needed (skin breakout on right thigh).   Yes [provider]  ibuprofen (ADVIL,MOTRIN) 200 MG tablet Take 400 mg by mouth every 6 (six) hours as needed for moderate pain.   Yes [provider]  levonorgestrel (MIRENA) 20 MCG/24HR IUD 1 each by Intrauterine route once.   Yes [provider]  benzonatate (TESSALON) 100 MG capsule Take 1 capsule (100 mg total) by mouth every 8 (eight) hours. Patient not taking: Reported on 03/03/2018 08/22/17   Maxwell Caul, PA-C  diphenhydrAMINE (BENADRYL) 25 MG tablet Take 2 tablets (50 mg total) by mouth every 4 (four) hours as needed for up to 5 days for itching (rash). 03/03/18 03/08/18  Pricilla Loveless, MD  fluticasone (FLONASE) 50 MCG/ACT nasal spray Place 1 spray into both nostrils daily. Patient not taking: Reported on 03/03/2018 08/22/17   Graciella Freer A, PA-C  hydrOXYzine (ATARAX/VISTARIL) 25 MG tablet Take 1 tablet (25 mg total) by mouth every 6 (six) hours. Patient not taking: Reported on 08/22/2017 05/31/16   Emi Holes, PA-C  ondansetron (ZOFRAN ODT) 4 MG disintegrating tablet Take 1 tablet (4 mg total) by mouth every 8 (eight) hours as needed for nausea or vomiting. Patient not taking: Reported on  08/22/2017 09/23/16   Deatra Canterxford, William J, FNP  oseltamivir (TAMIFLU) 75 MG capsule Take 1 capsule (75 mg total) by mouth every 12 (twelve) hours. Patient not taking: Reported on 08/22/2017 09/23/16   Deatra Canterxford, William J, FNP  triamcinolone cream (KENALOG) 0.1 % Apply 1 application topically 2 (two) times daily. Patient not taking: Reported on 08/22/2017 09/16/15   Verneda SkillHacker, Caroline T, FNP    Family History No family history on file.  Social History Social History   Tobacco Use  . Smoking status: Never Smoker  . Smokeless tobacco: Never Used  Substance Use Topics  . Alcohol use: No    Alcohol/week: 0.0 oz  . Drug use: No     Allergies   Patient has no known allergies.   Review of Systems Review of Systems  HENT: Negative  for facial swelling.   Respiratory: Negative for shortness of breath.   Skin: Positive for rash.     Physical Exam Updated Vital Signs BP (!) 125/56 (BP Location: Right Arm)   Pulse 93   Temp 98 F (36.7 C) (Oral)   Resp 18   SpO2 100%   Physical Exam  Constitutional: She is oriented to person, place, and time. She appears well-developed and well-nourished. No distress.  obese  HENT:  Head: Normocephalic and atraumatic.  Right Ear: External ear normal.  Left Ear: External ear normal.  Nose: Nose normal.  Mouth/Throat: Oropharynx is clear and moist.  No lip/tongue swelling Normal voice, no stridor  Eyes: Right eye exhibits no discharge. Left eye exhibits no discharge.  Cardiovascular: Normal rate, regular rhythm and normal heart sounds.  Pulmonary/Chest: Effort normal and breath sounds normal. No stridor. She has no wheezes.  Neurological: She is alert and oriented to person, place, and time.  Skin: Skin is warm and dry. Rash noted. She is not diaphoretic.  Large areas of confluent hives along BLE, mostly thighs. No obvious infection  Nursing note and vitals reviewed.    ED Treatments / Results  Labs (all labs ordered are listed, but only abnormal results are displayed) Labs Reviewed - No data to display  EKG None  Radiology No results found.  Procedures Procedures (including critical care time)  Medications Ordered in ED Medications  diphenhydrAMINE (BENADRYL) capsule 50 mg (has no administration in time range)     Initial Impression / Assessment and Plan / ED Course  I have reviewed the triage vital signs and the nursing notes.  Pertinent labs & imaging results that were available during my care of the patient were reviewed by me and considered in my medical decision making (see chart for details).     Rash appears like hives.  Unclear what is causing it.  Thus it is hard to tell her what to avoid.  I think at this point it is reasonable to try Benadryl  more often around every 4 hours and if it is getting worse may need steroids.  However I think with no other symptoms and only in her lower extremities I think Benadryl at first is reasonable.  Discussed return precautions.  Follow-up with an allergist.  Final Clinical Impressions(s) / ED Diagnoses   Final diagnoses:  Hives    ED Discharge Orders        Ordered    diphenhydrAMINE (BENADRYL) 25 MG tablet  Every 4 hours PRN     03/03/18 1602       Pricilla LovelessGoldston, Markhi Kleckner, MD 03/03/18 1608

## 2018-03-03 NOTE — Discharge Instructions (Signed)
If your rash worsens or you develop lip or tongue swelling, trouble breathing or speaking, or sore/tight throat then return to the ER for evaluation.  If you develop dizziness or vomiting or any other new/concerning symptoms and come back to the ER.  Otherwise take the Benadryl/diphenhydramine every 4-6 hours.

## 2018-03-03 NOTE — ED Notes (Signed)
Pt has multiple hives to bilateral lower extremities and buttocks. Pt denies any new or change in anything. Pt reports that it first appeared on 7/4 but dissipated but came back yesterday

## 2018-08-22 ENCOUNTER — Emergency Department (HOSPITAL_COMMUNITY)
Admission: EM | Admit: 2018-08-22 | Discharge: 2018-08-22 | Disposition: A | Discharge status: Home / Self Care | Payer: Self-pay | Attending: Emergency Medicine | Admitting: Emergency Medicine

## 2018-08-22 ENCOUNTER — Other Ambulatory Visit: Payer: Self-pay

## 2018-08-22 ENCOUNTER — Encounter (HOSPITAL_COMMUNITY): Payer: Self-pay | Admitting: Emergency Medicine

## 2018-08-22 DIAGNOSIS — J111 Influenza due to unidentified influenza virus with other respiratory manifestations: Secondary | ICD-10-CM | POA: Insufficient documentation

## 2018-08-22 DIAGNOSIS — R69 Illness, unspecified: Secondary | ICD-10-CM

## 2018-08-22 MED ORDER — OSELTAMIVIR PHOSPHATE 75 MG PO CAPS: 75.0000 mg | ORAL_CAPSULE | Freq: Two times a day (BID) | ORAL | 0 refills | Status: DC

## 2018-08-22 NOTE — ED Provider Notes (Signed)
[facility]-EMER[facility] DEPT Provider Note   CSN: [id number] Arrival date & time: 08/22/18  4827     History   Chief Complaint Chief Complaint  Patient presents with  . Fever  . Cough    HPI [facility] is a 23 y.o. female.  Patient is a 23 year old female with history of hyperlipidemia.  She presents today for evaluation of fever, body aches, cough, and congestion since yesterday.  She denies any shortness of breath.  She denies any ill contacts.  She has taken over-the-counter medications with little relief.  The history is provided by the patient.  Fever   This is a new problem. The current episode started yesterday. The problem occurs constantly. The problem has been rapidly worsening. Her temperature was unmeasured prior to arrival. Pertinent negatives include no diarrhea and no vomiting. She has tried nothing for the symptoms.    Past Medical History:  Diagnosis Date  . Eczema   . Medical history non-contributory     Patient Active Problem List   Diagnosis Date Noted  . IUD check up 01/13/2016  . Prediabetes 09/16/2015  . Hyperlipidemia 09/16/2015  . Weight gain 09/04/2015  . Generalized headaches 09/04/2015  . Teenage mother 06/11/2013  . Contraception management 06/11/2013    Past Surgical History:  Procedure Laterality Date  . CESAREAN SECTION N/A 05/08/2013   Procedure: CESAREAN SECTION;  Surgeon: Brock Bad, MD;  Location: [facility];  Service: Obstetrics;  Laterality: N/A;  . NO PAST SURGERIES       OB History    Gravida  1   Para  1   Term  1   Preterm  0   AB  0   Living  1     SAB  0   TAB      Ectopic  0   Multiple  0   Live Births  1            Home Medications    Prior to Admission medications   Medication Sig Start Date End Date Taking? Authorizing Provider  benzonatate (TESSALON) 100 MG capsule Take 1 capsule (100 mg total) by mouth every 8 (eight) hours. Patient not taking: Reported on  03/03/2018 08/22/17   Maxwell Caul, PA-C  diphenhydrAMINE (BENADRYL) 25 MG tablet Take 2 tablets (50 mg total) by mouth every 4 (four) hours as needed for up to 5 days for itching (rash). 03/03/18 03/08/18  Pricilla Loveless, MD  fluticasone (FLONASE) 50 MCG/ACT nasal spray Place 1 spray into both nostrils daily. Patient not taking: Reported on 03/03/2018 08/22/17   Maxwell Caul, PA-C  Hydrocortisone-Aloe 0.5 % CREA Apply 1 application topically daily as needed (skin breakout on right thigh).    [provider]  hydrOXYzine (ATARAX/VISTARIL) 25 MG tablet Take 1 tablet (25 mg total) by mouth every 6 (six) hours. Patient not taking: Reported on 08/22/2017 05/31/16   Emi Holes, PA-C  ibuprofen (ADVIL,MOTRIN) 200 MG tablet Take 400 mg by mouth every 6 (six) hours as needed for moderate pain.    [provider]  levonorgestrel (MIRENA) 20 MCG/24HR IUD 1 each by Intrauterine route once.    [provider]  ondansetron (ZOFRAN ODT) 4 MG disintegrating tablet Take 1 tablet (4 mg total) by mouth every 8 (eight) hours as needed for nausea or vomiting. Patient not taking: Reported on 08/22/2017 09/23/16   Deatra Canter, FNP  oseltamivir (TAMIFLU) 75 MG capsule Take 1 capsule (75 mg total)  by mouth every 12 (twelve) hours. Patient not taking: Reported on 08/22/2017 09/23/16   Deatra Canter, FNP  triamcinolone cream (KENALOG) 0.1 % Apply 1 application topically 2 (two) times daily. Patient not taking: Reported on 08/22/2017 09/16/15   Verneda Skill, FNP    Family History History reviewed. No pertinent family history.  Social History Social History   Tobacco Use  . Smoking status: Never Smoker  . Smokeless tobacco: Never Used  Substance Use Topics  . Alcohol use: No    Alcohol/week: 0.0 standard drinks  . Drug use: No     Allergies   Patient has no known allergies.   Review of Systems Review of Systems  Constitutional: Positive for fever.  Gastrointestinal:  Negative for diarrhea and vomiting.  All other systems reviewed and are negative.    Physical Exam Updated Vital Signs BP 127/63 (BP Location: Left Arm)   Pulse 100   Temp 99.7 F (37.6 C)   Resp 20   Ht 5\' 2"  (1.575 m)   Wt 97.5 kg   LMP 08/03/2018   SpO2 99%   BMI 39.32 kg/m   Physical Exam Vitals signs and nursing note reviewed.  Constitutional:      General: She is not in acute distress.    Appearance: She is well-developed. She is not diaphoretic.  HENT:     Head: Normocephalic and atraumatic.     Right Ear: Tympanic membrane normal.     Left Ear: Tympanic membrane normal.     Mouth/Throat:     Mouth: Mucous membranes are moist.  Neck:     Musculoskeletal: Normal range of motion and neck supple.  Cardiovascular:     Rate and Rhythm: Normal rate and regular rhythm.     Heart sounds: No murmur. No friction rub. No gallop.   Pulmonary:     Effort: Pulmonary effort is normal. No respiratory distress.     Breath sounds: Normal breath sounds. No wheezing.  Abdominal:     General: Bowel sounds are normal. There is no distension.     Palpations: Abdomen is soft.     Tenderness: There is no abdominal tenderness.  Musculoskeletal: Normal range of motion.  Skin:    General: Skin is warm and dry.  Neurological:     Mental Status: She is alert and oriented to person, place, and time.      ED Treatments / Results  Labs (all labs ordered are listed, but only abnormal results are displayed) Labs Reviewed - No data to display  EKG None  Radiology No results found.  Procedures Procedures (including critical care time)  Medications Ordered in ED Medications - No data to display   Initial Impression / Assessment and Plan / ED Course  I have reviewed the triage vital signs and the nursing notes.  Pertinent labs & imaging results that were available during my care of the patient were reviewed by me and considered in my medical decision making (see chart for  details).  Patient presents here with multiple symptoms that I suspect to be viral in nature, likely influenza.  Her physical examination is otherwise unremarkable.  Vitals are stable and there is no hypoxia.  I see no indication for additional testing.  Her symptoms started yesterday, so Tamiflu will be prescribed.  She is to take over-the-counter medications as needed for relief of symptoms and take in plenty of fluids for the next several days.  Final Clinical Impressions(s) / ED Diagnoses   Final  diagnoses:  None    ED Discharge Orders    None       Geoffery Lyonselo, Daron Breeding, MD 08/22/18 [phone]

## 2018-08-22 NOTE — Discharge Instructions (Addendum)
Tamiflu as prescribed.  Tylenol 1000 mg rotated with ibuprofen 600 mg every 4 hours as needed for pain or fever.  Drink plenty of fluids and get plenty of rest.  Over-the-counter medications as needed for relief of symptoms.

## 2018-08-22 NOTE — ED Triage Notes (Signed)
Pt reports having fever, body aches, cough, and sweats. Pt reports taking Nyquil and tylenol yesterday.

## 2020-01-17 ENCOUNTER — Other Ambulatory Visit: Payer: Self-pay

## 2020-02-10 ENCOUNTER — Other Ambulatory Visit: Payer: Self-pay

## 2020-02-10 ENCOUNTER — Ambulatory Visit (HOSPITAL_COMMUNITY)
Admission: EM | Admit: 2020-02-10 | Discharge: 2020-02-10 | Disposition: A | Discharge status: Home / Self Care | Payer: Self-pay | Attending: Family Medicine | Admitting: Family Medicine

## 2020-02-10 ENCOUNTER — Encounter (HOSPITAL_COMMUNITY): Payer: Self-pay

## 2020-02-10 DIAGNOSIS — J029 Acute pharyngitis, unspecified: Secondary | ICD-10-CM | POA: Insufficient documentation

## 2020-02-10 LAB — POCT RAPID STREP A: Streptococcus, Group A Screen (Direct): NEGATIVE

## 2020-02-10 MED ORDER — AZITHROMYCIN 250 MG PO TABS: 250.0000 mg | ORAL_TABLET | Freq: Every day | ORAL | 0 refills | Status: DC

## 2020-02-10 NOTE — Discharge Instructions (Addendum)
Please continue ibuprofen  Please use the antibiotic if your symptoms fail to improve after a couple of days.  Please try honey, vick's vapor rub, lozenges and humidifer for sore throat  Please follow up if your symptoms fail to improve.

## 2020-02-10 NOTE — ED Triage Notes (Addendum)
Pt presents to UC with sore throat, body aches, chills x 1 day. Pt had COVID, last day of quarantine was 02/04/2020.

## 2020-02-10 NOTE — ED Provider Notes (Signed)
[place]    CSN: [id number] Arrival date & time: 02/10/20  1753      History   Chief Complaint Chief Complaint  Patient presents with  . Sore Throat    HPI [facility] is a 24 y.o. female. She is presenting with sore throat for one day. Has tried several over the counter measures with no improvement. No fevers. No exposure with anyone with similar symptoms.   HPI  Past Medical History:  Diagnosis Date  . Eczema   . Medical history non-contributory     Patient Active Problem List   Diagnosis Date Noted  . IUD check up 01/13/2016  . Prediabetes 09/16/2015  . Hyperlipidemia 09/16/2015  . Weight gain 09/04/2015  . Generalized headaches 09/04/2015  . Teenage mother 06/11/2013  . Contraception management 06/11/2013    Past Surgical History:  Procedure Laterality Date  . CESAREAN SECTION N/A 05/08/2013   Procedure: CESAREAN SECTION;  Surgeon: Shelly Bombard, MD;  Location: [facility];  Service: Obstetrics;  Laterality: N/A;  . NO PAST SURGERIES      OB History    Gravida  1   Para  1   Term  1   Preterm  0   AB  0   Living  1     SAB  0   TAB      Ectopic  0   Multiple  0   Live Births  1            Home Medications    Prior to Admission medications   Medication Sig Start Date End Date Taking? Authorizing Provider  azithromycin (ZITHROMAX) 250 MG tablet Take 1 tablet (250 mg total) by mouth daily. Take first 2 tablets together, then 1 every day until finished. 02/10/20   Rosemarie Ax, MD  diphenhydrAMINE (BENADRYL) 25 MG tablet Take 2 tablets (50 mg total) by mouth every 4 (four) hours as needed for up to 5 days for itching (rash). 03/03/18 03/08/18  Sherwood Gambler, MD  Hydrocortisone-Aloe 0.5 % CREA Apply 1 application topically daily as needed (skin breakout on right thigh).    [provider]  ibuprofen (ADVIL,MOTRIN) 200 MG tablet Take 400 mg by mouth every 6 (six) hours as needed for moderate pain.     [provider]  oseltamivir (TAMIFLU) 75 MG capsule Take 1 capsule (75 mg total) by mouth every 12 (twelve) hours. 08/22/18   Veryl Speak, MD  fluticasone (FLONASE) 50 MCG/ACT nasal spray Place 1 spray into both nostrils daily. Patient not taking: Reported on 03/03/2018 08/22/17 02/10/20  Volanda Napoleon PA-C    Family History History reviewed. No pertinent family history.  Social History Social History   Tobacco Use  . Smoking status: Never Smoker  . Smokeless tobacco: Never Used  Vaping Use  . Vaping Use: Never used  Substance Use Topics  . Alcohol use: No    Alcohol/week: 0.0 standard drinks  . Drug use: No     Allergies   Patient has no known allergies.   Review of Systems Review of Systems  See HPI   Physical Exam Triage Vital Signs ED Triage Vitals  Enc Vitals Group     BP 02/10/20 1839 109/86     Pulse Rate 02/10/20 1839 94     Resp 02/10/20 1839 20     Temp 02/10/20 1839 98.6 F (37 C)     Temp Source 02/10/20 1839 Oral     SpO2 02/10/20 1839  98 %     Weight --      Height --      Head Circumference --      Peak Flow --      Pain Score 02/10/20 1836 10     Pain Loc --      Pain Edu? --      Excl. in GC? --    No data found.  Updated Vital Signs BP 109/86 (BP Location: Right Arm)   Pulse 94   Temp 98.6 F (37 C) (Oral)   Resp 20   LMP  (Within Months) Comment: 1 month  SpO2 98%   Visual Acuity Right Eye Distance:   Left Eye Distance:   Bilateral Distance:    Right Eye Near:   Left Eye Near:    Bilateral Near:     Physical Exam Gen: NAD, alert, cooperative with exam, well-appearing ENT: normal lips, normal nasal mucosa, some exudates of the tonsils but limited erythema  Eye: normal EOM, normal conjunctiva and lids Resp: no accessory muscle use, non-labored,   Skin: no rashes, no areas of induration  Neuro: normal tone, normal sensation to touch    UC Treatments / Results  Labs (all labs ordered are listed, but only  abnormal results are displayed) Labs Reviewed  CULTURE, GROUP A STREP [facility])  POCT RAPID STREP A    EKG   Radiology No results found.  Procedures Procedures (including critical care time)  Medications Ordered in UC Medications - No data to display  Initial Impression / Assessment and Plan / UC Course  I have reviewed the triage vital signs and the nursing notes.  Pertinent labs & imaging results that were available during my care of the patient were reviewed by me and considered in my medical decision making (see chart for details).     Wallace is a 24 yo F that is presenting with sore throat. Rapid strep is negative. Will send culture. Provided azithromycin to have as needed. Counseled on supportive care. Given indications on follow up.   Final Clinical Impressions(s) / UC Diagnoses   Final diagnoses:  Sore throat     Discharge Instructions     Please continue ibuprofen  Please use the antibiotic if your symptoms fail to improve after a couple of days.  Please try honey, vick's vapor rub, lozenges and humidifer for sore throat  Please follow up if your symptoms fail to improve.     ED Prescriptions    Medication Sig Dispense Auth. Provider   azithromycin (ZITHROMAX) 250 MG tablet Take 1 tablet (250 mg total) by mouth daily. Take first 2 tablets together, then 1 every day until finished. 6 tablet Myra Rude, MD     PDMP not reviewed this encounter.   Myra Rude, MD 02/10/20 2157

## 2020-02-11 ENCOUNTER — Telehealth: Payer: Self-pay

## 2020-02-11 NOTE — Telephone Encounter (Signed)
[facility] would like to make appointment to have Mirena IUD placed in 2017 removed.

## 2020-02-11 NOTE — Telephone Encounter (Signed)
Please call and schedule patient for IUD. Per message looks like they had one in 2017. Belenda Cruise, can you please check on this? Thank you.

## 2020-02-12 LAB — CULTURE, GROUP A STREP (THRC)

## 2020-02-18 ENCOUNTER — Ambulatory Visit (INDEPENDENT_AMBULATORY_CARE_PROVIDER_SITE_OTHER): Payer: Self-pay | Admitting: Pediatrics

## 2020-02-18 ENCOUNTER — Encounter: Payer: Self-pay | Admitting: Pediatrics

## 2020-02-18 VITALS — BP 121/75 | HR 71 | Ht 61.42 in | Wt 231.8 lb

## 2020-02-18 DIAGNOSIS — Z975 Presence of (intrauterine) contraceptive device: Secondary | ICD-10-CM

## 2020-02-18 DIAGNOSIS — Z30016 Encounter for initial prescription of transdermal patch hormonal contraceptive device: Secondary | ICD-10-CM

## 2020-02-18 DIAGNOSIS — N921 Excessive and frequent menstruation with irregular cycle: Secondary | ICD-10-CM

## 2020-02-18 DIAGNOSIS — Z30432 Encounter for removal of intrauterine contraceptive device: Secondary | ICD-10-CM

## 2020-02-18 MED ORDER — NORELGESTROMIN-ETH ESTRADIOL 150-35 MCG/24HR TD PTWK: 1.0000 | MEDICATED_PATCH | TRANSDERMAL | 12 refills | Status: DC

## 2020-02-18 MED ORDER — LEVONORGESTREL 1.5 MG PO TABS: 1.5000 mg | ORAL_TABLET | Freq: Once | ORAL | 0 refills | Status: AC

## 2020-02-18 NOTE — Patient Instructions (Addendum)
Call if you are bleeding through more than 1 pad every two hours or passing clots larger than a quarter  Ethinyl Estradiol; Norelgestromin skin patches What is this medicine? ETHINYL ESTRADIOL;NORELGESTROMIN (ETH in il es tra DYE ole; nor el JES troe min) skin patch is used as a contraceptive (birth control method). This medicine combines two types of female hormones, an estrogen and a progestin. This patch is used to prevent ovulation and pregnancy. This medicine may be used for other purposes; ask your health care provider or pharmacist if you have questions. COMMON BRAND NAME(S): Ortho Christianne Borrow What should I tell my health care provider before I take this medicine? They need to know if you have or ever had any of these conditions:  abnormal vaginal bleeding  blood vessel disease or blood clots  breast, cervical, endometrial, ovarian, liver, or uterine cancer  diabetes  gallbladder disease  having surgery  heart disease or recent heart attack  high blood pressure  high cholesterol or triglycerides  history of irregular heartbeat or heart valve problems  kidney disease  liver disease  migraine headaches  protein C deficiency  protein S deficiency  recently had a baby, miscarriage, or abortion  stroke  systemic lupus erythematosus (SLE)  tobacco smoker  an unusual or allergic reaction to estrogens, progestins, other medicines, foods, dyes, or preservatives  pregnant or trying to get pregnant  breast-feeding How should I use this medicine? This patch is applied to the skin. Follow the directions on the prescription label. Apply to clean, dry, healthy skin on the buttock, abdomen, upper outer arm or upper torso, in a place where it will not be rubbed by tight clothing. Do not use lotions or other cosmetics on the site where the patch will go. Press the patch firmly in place for 10 seconds to ensure good contact with the skin. Change the patch every 7 days on  the same day of the week for 3 weeks. You will then have a break from the patch for 1 week, after which you will apply a new patch. Do not use your medicine more often than directed. Contact your pediatrician regarding the use of this medicine in children. Special care may be needed. This medicine has been used in female children who have started having menstrual periods. A patient package insert for the product will be given with each prescription and refill. Read this sheet carefully each time. The sheet may change frequently. Overdosage: If you think you have taken too much of this medicine contact a poison control center or emergency room at once. NOTE: This medicine is only for you. Do not share this medicine with others. What if I miss a dose? You will need to replace your patch once a week as directed. If your patch is lost or falls off, contact your health care professional for advice. You may need to use another form of birth control if your patch has been off for more than 1 day. What may interact with this medicine? Do not take this medicine with the following medications:  dasabuvir; ombitasvir; paritaprevir; ritonavir  ombitasvir; paritaprevir; ritonavir This medicine may also interact with the following medications:  acetaminophen  antibiotics or medicines for infections, especially rifampin, rifabutin, rifapentine, and possibly penicillins or tetracyclines  aprepitant or fosaprepitant  armodafinil  ascorbic acid (vitamin C)  barbiturate medicines, such as phenobarbital or primidone  bosentan  certain antiviral medicines for hepatitis, HIV or AIDS  certain medicines for cancer treatment  certain medicines  for seizures like carbamazepine, clobazam, felbamate, lamotrigine, oxcarbazepine, phenytoin, rufinamide, topiramate  certain medicines for treating high cholesterol  cyclosporine  dantrolene  elagolix  flibanserin  grapefruit juice  lesinurad  medicines  for diabetes  medicines to treat fungal infections, such as griseofulvin, miconazole, fluconazole, ketoconazole, itraconazole, posaconazole or voriconazole  mifepristone  mitotane  modafinil  morphine  mycophenolate  St. John's wort  tamoxifen  temazepam  theophylline or aminophylline  thyroid hormones  tizanidine  tranexamic acid  ulipristal  warfarin This list may not describe all possible interactions. Give your health care provider a list of all the medicines, herbs, non-prescription drugs, or dietary supplements you use. Also tell them if you smoke, drink alcohol, or use illegal drugs. Some items may interact with your medicine. What should I watch for while using this medicine? Visit your doctor or health care professional for regular checks on your progress. You will need a regular breast and pelvic exam and Pap smear while on this medicine. Use an additional method of contraception during the first cycle that you use this patch. If you have any reason to think you are pregnant, stop using this medicine right away and contact your doctor or health care professional. If you are using this medicine for hormone related problems, it may take several cycles of use to see improvement in your condition. Smoking increases the risk of getting a blood clot or having a stroke while you are using hormonal birth control, especially if you are more than 24 years old. You are strongly advised not to smoke. This medicine can make your body retain fluid, making your fingers, hands, or ankles swell. Your blood pressure can go up. Contact your doctor or health care professional if you feel you are retaining fluid. This medicine can make you more sensitive to the sun. Keep out of the sun. If you cannot avoid being in the sun, wear protective clothing and use sunscreen. Do not use sun lamps or tanning beds/booths. If you wear contact lenses and notice visual changes, or if the lenses begin  to feel uncomfortable, consult your eye care specialist. In some women, tenderness, swelling, or minor bleeding of the gums may occur. Notify your dentist if this happens. Brushing and flossing your teeth regularly may help limit this. See your dentist regularly and inform your dentist of the medicines you are taking. If you are going to have elective surgery or a MRI, you may need to stop using this medicine before the surgery or MRI. Consult your health care professional for advice. This medicine does not protect you against HIV infection (AIDS) or any other sexually transmitted diseases. What side effects may I notice from receiving this medicine? Side effects that you should report to your doctor or health care professional as soon as possible:  allergic reactions such as skin rash or itching, hives, swelling of the lips, mouth, tongue, or throat  breast tissue changes or discharge  dark patches of skin on your forehead, cheeks, upper lip, and chin  depression  high blood pressure  migraines or severe, sudden headaches  missed menstrual periods  signs and symptoms of a blood clot such as breathing problems; changes in vision; chest pain; severe, sudden headache; pain, swelling, warmth in the leg; trouble speaking; sudden numbness or weakness of the face, arm or leg  skin reactions at the patch site such as blistering, bleeding, itching, rash, or swelling  stomach pain  yellowing of the eyes or skin Side effects that usually  do not require medical attention (report these to your doctor or health care professional if they continue or are bothersome):  breast tenderness  irregular vaginal bleeding or spotting, particularly during the first 3 months of use  headache  nausea  painful menstrual periods  skin redness or mild irritation at site where applied  weight gain (slight) This list may not describe all possible side effects. Call your doctor for medical advice about  side effects. You may report side effects to FDA at 1-800-FDA-1088. Where should I keep my medicine? Keep out of the reach of children. Store at room temperature between 15 and 30 degrees C (59 and 86 degrees F). Keep the patch in its pouch until time of use. Throw away any unused medicine after the expiration date. Dispose of used patches properly. Since a used patch may still contain active hormones, fold the patch in half so that it sticks to itself prior to disposal. Throw away in a place where children or pets cannot reach. NOTE: This sheet is a summary. It may not cover all possible information. If you have questions about this medicine, talk to your doctor, pharmacist, or health care provider.  2020 Elsevier/Gold Standard (2018-11-13 11:56:29)

## 2020-02-18 NOTE — Progress Notes (Signed)
History was provided by the patient.  Grace Harper is a 24 y.o. female who is here for breakthrough bleeding with IUD, requests removal.   Pa, [facility]   HPI:  Pt reports has had IUD for 4 years. Initially cycles were more regular, but for the past 6 months has been having more bleeding that is unpredictable and sometimes heavier. Same partner, sexually active without pain. Denies any other vaginal discharge or itching.   G1P1- her son is 6. She is not interested in another pregnancy at this time and does want another form of birth control- pill or patch.   + headaches, but no migraine with aura.   No LMP recorded.  Review of Systems  Constitutional: Negative for malaise/fatigue.  Eyes: Negative for double vision.  Respiratory: Negative for shortness of breath.   Cardiovascular: Negative for chest pain and palpitations.  Gastrointestinal: Negative for abdominal pain, constipation, diarrhea, nausea and vomiting.  Genitourinary: Negative for dysuria.  Musculoskeletal: Negative for joint pain and myalgias.  Skin: Negative for rash.  Neurological: Positive for headaches. Negative for dizziness.  Endo/Heme/Allergies: Does not bruise/bleed easily.    Patient Active Problem List   Diagnosis Date Noted  . IUD check up 01/13/2016  . Prediabetes 09/16/2015  . Hyperlipidemia 09/16/2015  . Weight gain 09/04/2015  . Generalized headaches 09/04/2015  . Teenage mother 06/11/2013  . Contraception management 06/11/2013    Current Outpatient Medications on File Prior to Visit  Medication Sig Dispense Refill  . ibuprofen (ADVIL,MOTRIN) 200 MG tablet Take 400 mg by mouth every 6 (six) hours as needed for moderate pain.    . diphenhydrAMINE (BENADRYL) 25 MG tablet Take 2 tablets (50 mg total) by mouth every 4 (four) hours as needed for up to 5 days for itching (rash). 60 tablet 0  . [DISCONTINUED] fluticasone (FLONASE) 50 MCG/ACT nasal spray Place 1 spray into both nostrils daily.  (Patient not taking: Reported on 03/03/2018) 16 g 2   No current facility-administered medications on file prior to visit.    No Known Allergies   Physical Exam:    Vitals:   02/18/20 1207  BP: 121/75  Pulse: 71  Weight: 231 lb 12.8 oz (105.1 kg)  Height: 5' 1.42" (1.56 m)    Growth percentile SmartLinks can only be used for patients less than 6 years old.  Physical Exam Vitals and nursing note reviewed. Exam conducted with a chaperone present.  Constitutional:      General: She is not in acute distress.    Appearance: She is well-developed.  Neck:     Thyroid: No thyromegaly.  Cardiovascular:     Rate and Rhythm: Normal rate and regular rhythm.     Heart sounds: No murmur heard.   Pulmonary:     Breath sounds: Normal breath sounds.  Abdominal:     Palpations: Abdomen is soft. There is no mass.     Tenderness: There is no abdominal tenderness. There is no guarding.  Genitourinary:    Vagina: Bleeding present.     Cervix: Cervical bleeding present.     Comments: IUD visualized and removed Musculoskeletal:     Right lower leg: No edema.     Left lower leg: No edema.  Lymphadenopathy:     Cervical: No cervical adenopathy.  Skin:    General: Skin is warm.     Findings: No rash.  Neurological:     Mental Status: She is alert.     Comments: No tremor  Psychiatric:  Mood and Affect: Mood normal.     Assessment/Plan: 1. Encounter for IUD removal IUD removed by Bernell List, NP after attempt by myself. Required some manipulation with brush as it was somewhat difficult to remove. Pt without significant pain after removal and stayed for 15 minutes to monitor bleeding which was not significant- is also on cycle at this time.   2. Breakthrough bleeding associated with intrauterine device (IUD) Will screen for infection.  - C. trachomatis/N. gonorrhoeae RNA - WET PREP BY MOLECULAR PROBE  3. Encounter for initial prescription of transdermal patch hormonal  contraceptive device Pt desires patch at this time for contraception. Will follow-up via mychart  - norelgestromin-ethinyl estradiol (ORTHO EVRA) 150-35 MCG/24HR transdermal patch; Place 1 patch onto the skin once a week.  Dispense: 3 patch; Refill: 12   Alfonso Ramus, FNP

## 2020-02-19 LAB — WET PREP BY MOLECULAR PROBE
Candida species: NOT DETECTED
Gardnerella vaginalis: NOT DETECTED
MICRO NUMBER:: 10647628
SPECIMEN QUALITY:: ADEQUATE
Trichomonas vaginosis: NOT DETECTED

## 2020-02-19 LAB — C. TRACHOMATIS/N. GONORRHOEAE RNA
C. trachomatis RNA, TMA: NOT DETECTED
N. gonorrhoeae RNA, TMA: NOT DETECTED

## 2020-02-21 NOTE — Telephone Encounter (Signed)
Patient visit has already been scheduled and completed with CHacker,FNP.

## 2020-05-09 ENCOUNTER — Other Ambulatory Visit: Payer: Self-pay

## 2020-05-09 ENCOUNTER — Encounter (HOSPITAL_COMMUNITY): Payer: Self-pay | Admitting: Obstetrics and Gynecology

## 2020-05-09 ENCOUNTER — Inpatient Hospital Stay (HOSPITAL_COMMUNITY)
Admission: AD | Admit: 2020-05-09 | Discharge: 2020-05-09 | Disposition: A | Discharge status: Home / Self Care | Payer: Medicaid Other | Attending: Obstetrics and Gynecology | Admitting: Obstetrics and Gynecology

## 2020-05-09 DIAGNOSIS — O26891 Other specified pregnancy related conditions, first trimester: Secondary | ICD-10-CM

## 2020-05-09 DIAGNOSIS — Z3A01 Less than 8 weeks gestation of pregnancy: Secondary | ICD-10-CM | POA: Insufficient documentation

## 2020-05-09 DIAGNOSIS — Z3201 Encounter for pregnancy test, result positive: Secondary | ICD-10-CM | POA: Insufficient documentation

## 2020-05-09 DIAGNOSIS — R110 Nausea: Secondary | ICD-10-CM | POA: Insufficient documentation

## 2020-05-09 DIAGNOSIS — Z79899 Other long term (current) drug therapy: Secondary | ICD-10-CM | POA: Diagnosis not present

## 2020-05-09 DIAGNOSIS — O26899 Other specified pregnancy related conditions, unspecified trimester: Secondary | ICD-10-CM

## 2020-05-09 LAB — POCT PREGNANCY, URINE: Preg Test, Ur: POSITIVE — AB

## 2020-05-09 MED ORDER — METOCLOPRAMIDE HCL 10 MG PO TABS: 10.0000 mg | ORAL_TABLET | Freq: Three times a day (TID) | ORAL | 1 refills | Status: DC | PRN

## 2020-05-09 NOTE — MAU Provider Note (Signed)
Chief Complaint: Nausea and Possible Pregnancy   First Provider Initiated Contact with Patient 05/09/20 1046     SUBJECTIVE HPI: Grace Harper is a 24 y.o. G2P1001 at [redacted]w[redacted]d who presents to Maternity Admissions reporting missed period & nausea. LMP was 8/1. Took a pregnancy test 10 days ago that was negative. Does report some nausea but no vomiting. Denies abdominal pain or vaginal bleeding. Plans on going to [facility] for prenatal care.   Past Medical History:  Diagnosis Date  . Eczema    OB History  Gravida Para Term Preterm AB Living  2 1 1  0 0 1  SAB TAB Ectopic Multiple Live Births  0   0 0 1    # Outcome Date GA Lbr Len/2nd Weight Sex Delivery Anes PTL Lv  2 Current           1 Term 05/08/13 [redacted]w[redacted]d  3160 g M CS-LTranv EPI  LIV   Past Surgical History:  Procedure Laterality Date  . CESAREAN SECTION N/A 05/08/2013   Procedure: CESAREAN SECTION;  Surgeon: 05/10/2013, MD;  Location: [facility];  Service: Obstetrics;  Laterality: N/A;   Social History   Socioeconomic History  . Marital status: Single    Spouse name: Not on file  . Number of children: Not on file  . Years of education: Not on file  . Highest education level: Not on file  Occupational History  . Not on file  Tobacco Use  . Smoking status: Never Smoker  . Smokeless tobacco: Never Used  Vaping Use  . Vaping Use: Never used  Substance and Sexual Activity  . Alcohol use: No    Alcohol/week: 0.0 standard drinks  . Drug use: No  . Sexual activity: Yes    Birth control/protection: None  Other Topics Concern  . Not on file  Social History Narrative  . Not on file   Social Determinants of Health   Financial Resource Strain:   . Difficulty of Paying Living Expenses: Not on file  Food Insecurity:   . Worried About [place] in the Last Year: Not on file  . Ran Out of Food in the Last Year: Not on file  Transportation Needs:   . Lack of Transportation (Medical): Not on file  . Lack of  Transportation (Non-Medical): Not on file  Physical Activity:   . Days of Exercise per Week: Not on file  . Minutes of Exercise per Session: Not on file  Stress:   . Feeling of Stress : Not on file  Social Connections:   . Frequency of Communication with Friends and Family: Not on file  . Frequency of Social Gatherings with Friends and Family: Not on file  . Attends Religious Services: Not on file  . Active Member of Clubs or Organizations: Not on file  . Attends Programme researcher, broadcasting/film/video Meetings: Not on file  . Marital Status: Not on file  Intimate Partner Violence:   . Fear of Current or Ex-Partner: Not on file  . Emotionally Abused: Not on file  . Physically Abused: Not on file  . Sexually Abused: Not on file   History reviewed. No pertinent family history. No current facility-administered medications on file prior to encounter.   Current Outpatient Medications on File Prior to Encounter  Medication Sig Dispense Refill  . diphenhydrAMINE (BENADRYL) 25 MG tablet Take 2 tablets (50 mg total) by mouth every 4 (four) hours as needed for up to 5 days for itching (rash). 60  tablet 0  . ibuprofen (ADVIL,MOTRIN) 200 MG tablet Take 400 mg by mouth every 6 (six) hours as needed for moderate pain.    Marland Kitchen norelgestromin-ethinyl estradiol (ORTHO EVRA) 150-35 MCG/24HR transdermal patch Place 1 patch onto the skin once a week. 3 patch 12  . [DISCONTINUED] fluticasone (FLONASE) 50 MCG/ACT nasal spray Place 1 spray into both nostrils daily. (Patient not taking: Reported on 03/03/2018) 16 g 2   No Known Allergies  I have reviewed patient's Past Medical Hx, Surgical Hx, Family Hx, Social Hx, medications and allergies.   Review of Systems  Constitutional: Negative.   Gastrointestinal: Positive for nausea. Negative for abdominal pain, diarrhea and vomiting.  Genitourinary: Negative.     OBJECTIVE Patient Vitals for the past 24 hrs:  BP Temp Temp src Pulse Resp SpO2 Height Weight  05/09/20 1024  122/66 98.2 F (36.8 C) Oral 85 16 100 % 5' (1.524 m) 103.1 kg   Constitutional: Well-developed, well-nourished female in no acute distress.  Cardiovascular: normal rate & rhythm, no murmur Respiratory: normal rate and effort. Lung sounds clear throughout GI: Abd soft, non-tender, Pos BS x 4. No guarding or rebound tenderness MS: Extremities nontender, no edema, normal ROM Neurologic: Alert and oriented x 4.    LAB RESULTS Results for orders placed or performed during the hospital encounter of 05/09/20 (from the past 24 hour(s))  Pregnancy, urine POC     Status: Abnormal   Collection Time: 05/09/20 10:33 AM  Result Value Ref Range   Preg Test, Ur POSITIVE (A) NEGATIVE    IMAGING No results found.  MAU COURSE Orders Placed This Encounter  Procedures  . Pregnancy, urine POC  . Discharge patient   Meds ordered this encounter  Medications  . metoCLOPramide (REGLAN) 10 MG tablet    Sig: Take 1 tablet (10 mg total) by mouth every 8 (eight) hours as needed for nausea.    Dispense:  30 tablet    Refill:  1    Order Specific Question:   Supervising Provider    Answer:   [place] Bing [[id number]]    MDM UPT is positive. EDD [redacted]w[redacted]d based on LMP. Reports no abdominal pain or vaginal bleeding. Endorses some nausea but no vomiting & appears well. WIll rx reglan for nausea. Plans on going to [facility] for prenatal care. Will also give info to apply for pregnancy medicaid.   ASSESSMENT 1. Pregnancy related nausea, antepartum   2. Positive pregnancy test   3. [redacted] weeks gestation of pregnancy     PLAN Discharge home in stable condition. Rx reglan Start prenatal care Reviewed reasons to return to [facility]U    Follow-up Information    [facility]. Schedule an appointment as soon as possible for a visit.   Specialty: Obstetrics and Gynecology Contact information: [address], [address] [place] [address] [phone]              Allergies as of 05/09/2020   No Known Allergies     Medication List    STOP taking these medications   diphenhydrAMINE 25 MG tablet Commonly known as: BENADRYL   ibuprofen 200 MG tablet Commonly known as: ADVIL   norelgestromin-ethinyl estradiol 150-35 MCG/24HR transdermal patch Commonly known as: ORTHO EVRA     TAKE these medications   metoCLOPramide 10 MG tablet Commonly known as: REGLAN Take 1 tablet (10 mg total) by mouth every 8 (eight) hours as needed for nausea.  Judeth Horn, NP 05/09/2020  11:04 AM

## 2020-05-09 NOTE — MAU Note (Signed)
Missed period, neg test about 10 days.  Has been nauseated, has not thrown up. No pain or bleeding.

## 2020-05-09 NOTE — Discharge Instructions (Signed)
    ________________________________________     To schedule your Maternity Eligibility Appointment, please call [phone].  When you arrive for your appointment you must bring the following items or information listed below.  Your appointment will be rescheduled if you do not have these items or are 15 minutes late. If currently receiving Medicaid, you MUST bring: 1. Medicaid Card 2. Social Security Card 3. Picture ID 4. Proof of Pregnancy 5. Verification of current address if the address on Medicaid card is incorrect "postmarked mail" If not receiving Medicaid, you MUST bring: 1. Social Security Card 2. Picture ID 3. Birth Certificate (if available) Passport or *Green Card 4. Proof of Pregnancy 5. Verification of current address "postmarked mail" for each income presented. 6. Verification of insurance coverage, if any 7. Check stubs from each employer for the previous month (if unable to present check stub  for each week, we will accept check stub for the first and last week ill the same month.) If you can't locate check stubs, you must bring a letter from the employer(s) and it must have the following information on letterhead, typed, in English: o name of company o company telephone number o how long been with the company, if less than one month o how much person earns per hour o how many hours per week work o the gross pay the person earned for the previous month If you are 24 years old or less, you do not have to bring proof of income unless you work or live with the father of the baby and at that time we will need proof of income from you and/or the father of the baby. Green Card recipients are eligible for Medicaid for Pregnant Women (MPW)

## 2020-05-29 ENCOUNTER — Inpatient Hospital Stay (HOSPITAL_COMMUNITY): Payer: Medicaid Other

## 2020-05-29 ENCOUNTER — Encounter (HOSPITAL_COMMUNITY): Payer: Self-pay | Admitting: Obstetrics and Gynecology

## 2020-05-29 ENCOUNTER — Inpatient Hospital Stay (HOSPITAL_COMMUNITY)
Admission: AD | Admit: 2020-05-29 | Discharge: 2020-05-29 | Disposition: A | Discharge status: Home / Self Care | Payer: Medicaid Other | Attending: Obstetrics and Gynecology | Admitting: Obstetrics and Gynecology

## 2020-05-29 DIAGNOSIS — O26891 Other specified pregnancy related conditions, first trimester: Secondary | ICD-10-CM

## 2020-05-29 DIAGNOSIS — O209 Hemorrhage in early pregnancy, unspecified: Secondary | ICD-10-CM | POA: Diagnosis present

## 2020-05-29 DIAGNOSIS — Z3A01 Less than 8 weeks gestation of pregnancy: Secondary | ICD-10-CM | POA: Insufficient documentation

## 2020-05-29 DIAGNOSIS — Z6720 Type B blood, Rh positive: Secondary | ICD-10-CM | POA: Diagnosis not present

## 2020-05-29 DIAGNOSIS — O34219 Maternal care for unspecified type scar from previous cesarean delivery: Secondary | ICD-10-CM | POA: Insufficient documentation

## 2020-05-29 DIAGNOSIS — Z3490 Encounter for supervision of normal pregnancy, unspecified, unspecified trimester: Secondary | ICD-10-CM

## 2020-05-29 DIAGNOSIS — N939 Abnormal uterine and vaginal bleeding, unspecified: Secondary | ICD-10-CM

## 2020-05-29 DIAGNOSIS — Z6790 Unspecified blood type, Rh positive: Secondary | ICD-10-CM | POA: Diagnosis not present

## 2020-05-29 DIAGNOSIS — O208 Other hemorrhage in early pregnancy: Secondary | ICD-10-CM

## 2020-05-29 LAB — COMPREHENSIVE METABOLIC PANEL
ALT: 14 U/L (ref 0–44)
AST: 19 U/L (ref 15–41)
Albumin: 3.4 g/dL — ABNORMAL LOW (ref 3.5–5.0)
Alkaline Phosphatase: 72 U/L (ref 38–126)
Anion gap: 10 (ref 5–15)
BUN: 5 mg/dL — ABNORMAL LOW (ref 6–20)
CO2: 21 mmol/L — ABNORMAL LOW (ref 22–32)
Calcium: 8.8 mg/dL — ABNORMAL LOW (ref 8.9–10.3)
Chloride: 102 mmol/L (ref 98–111)
Creatinine, Ser: 0.7 mg/dL (ref 0.44–1.00)
GFR, Estimated: >60 mL/min (ref >60)
Glucose, Bld: 97 mg/dL (ref 70–99)
Potassium: 3.5 mmol/L (ref 3.5–5.1)
Sodium: 133 mmol/L — ABNORMAL LOW (ref 135–145)
Total Bilirubin: 0.3 mg/dL (ref 0.3–1.2)
Total Protein: 6.6 g/dL (ref 6.5–8.1)

## 2020-05-29 LAB — URINALYSIS, ROUTINE W REFLEX MICROSCOPIC
Bilirubin Urine: NEGATIVE
Glucose, UA: NEGATIVE mg/dL
Hgb urine dipstick: NEGATIVE
Ketones, ur: NEGATIVE mg/dL
Leukocytes,Ua: NEGATIVE
Nitrite: NEGATIVE
Protein, ur: NEGATIVE mg/dL
Specific Gravity, Urine: 1.012 (ref 1.005–1.030)
pH: 5 (ref 5.0–8.0)

## 2020-05-29 LAB — CBC
HCT: 39.9 % (ref 36.0–46.0)
Hemoglobin: 12.8 g/dL (ref 12.0–15.0)
MCH: 26 pg (ref 26.0–34.0)
MCHC: 32.1 g/dL (ref 30.0–36.0)
MCV: 81.1 fL (ref 80.0–100.0)
Platelets: 167 10*3/uL (ref 150–400)
RBC: 4.92 MIL/uL (ref 3.87–5.11)
RDW: 15.1 % (ref 11.5–15.5)
WBC: 8.9 10*3/uL (ref 4.0–10.5)
nRBC: 0 % (ref 0.0–0.2)

## 2020-05-29 LAB — HCG, QUANTITATIVE, PREGNANCY: hCG, Beta Chain, Quant, S: 75734 m[IU]/mL — ABNORMAL HIGH (ref <5)

## 2020-05-29 LAB — WET PREP, GENITAL
Sperm: NONE SEEN
Trich, Wet Prep: NONE SEEN
Yeast Wet Prep HPF POC: NONE SEEN

## 2020-05-29 NOTE — MAU Provider Note (Signed)
History     CSN: [id number]  Arrival date and time: 05/29/20 2041   First Provider Initiated Contact with Patient 05/29/20 2143      Chief Complaint  Patient presents with  . Abdominal Pain  . Vaginal Bleeding   Ms. Grace Harper is a 24 y.o. G2P1001 at [redacted]w[redacted]d who presents to MAU for vaginal bleeding which began and ended at 1030am this morning. Patient reports she experienced one episode of light spotting while wiping after urination.  Passing blood clots? none Blood soaking clothes? no Lightheaded/dizzy? no Significant pelvic pain or cramping? Menstrual-like cramping  Current pregnancy problems? Pt has not yet been seen Blood Type? B Positive Allergies? NKDA Current medications? Reglan Current PNC & next appt? [facility], 06/02/2020  Pt denies vaginal discharge/odor/itching. Pt denies N/V, abdominal pain, constipation, diarrhea, or urinary problems. Pt denies fever, chills, fatigue, sweating or changes in appetite. Pt denies SOB or chest pain. Pt denies dizziness, HA, light-headedness, weakness.   OB History    Gravida  2   Para  1   Term  1   Preterm  0   AB  0   Living  1     SAB  0   TAB      Ectopic  0   Multiple  0   Live Births  1           Past Medical History:  Diagnosis Date  . Eczema     Past Surgical History:  Procedure Laterality Date  . CESAREAN SECTION N/A 05/08/2013   Procedure: CESAREAN SECTION;  Surgeon: Brock Bad, MD;  Location: [facility];  Service: Obstetrics;  Laterality: N/A;    History reviewed. No pertinent family history.  Social History   Tobacco Use  . Smoking status: Never Smoker  . Smokeless tobacco: Never Used  Vaping Use  . Vaping Use: Never used  Substance Use Topics  . Alcohol use: No    Alcohol/week: 0.0 standard drinks  . Drug use: No    Allergies: No Known Allergies  Medications Prior to Admission  Medication Sig Dispense Refill Last Dose  . metoCLOPramide (REGLAN) 10 MG tablet Take 1  tablet (10 mg total) by mouth every 8 (eight) hours as needed for nausea. 30 tablet 1 05/29/2020 at 1100    Review of Systems  Constitutional: Negative for chills, diaphoresis, fatigue and fever.  Eyes: Negative for visual disturbance.  Respiratory: Negative for shortness of breath.   Cardiovascular: Negative for chest pain.  Gastrointestinal: Negative for abdominal pain, constipation, diarrhea, nausea and vomiting.  Genitourinary: Positive for pelvic pain and vaginal bleeding. Negative for dysuria, flank pain, frequency, urgency and vaginal discharge.  Neurological: Negative for dizziness, weakness, light-headedness and headaches.   Physical Exam   Blood pressure (!) 113/51, pulse 66, temperature 98.4 F (36.9 C), resp. rate 18, height 5' (1.524 m), weight 101.1 kg, last menstrual period 03/22/2020.  Patient Vitals for the past 24 hrs:  BP Temp Pulse Resp Height Weight  05/29/20 2300 (!) 113/51 -- 66 -- -- --  05/29/20 2054 (!) 117/50 98.4 F (36.9 C) 83 18 5' (1.524 m) 101.1 kg   Physical Exam Vitals and nursing note reviewed.  Constitutional:      General: She is not in acute distress.    Appearance: Normal appearance. She is not ill-appearing, toxic-appearing or diaphoretic.  HENT:     Head: Normocephalic and atraumatic.  Pulmonary:     Effort: Pulmonary effort is normal.  Neurological:  Mental Status: She is alert and oriented to person, place, and time.  Psychiatric:        Mood and Affect: Mood normal.        Behavior: Behavior normal.        Thought Content: Thought content normal.        Judgment: Judgment normal.    Results for orders placed or performed during the hospital encounter of 05/29/20 (from the past 24 hour(s))  Urinalysis, Routine w reflex microscopic Urine, Clean Catch     Status: Abnormal   Collection Time: 05/29/20  9:07 PM  Result Value Ref Range   Color, Urine YELLOW YELLOW   APPearance HAZY (A) CLEAR   Specific Gravity, Urine 1.012 1.005 -  1.030   pH 5.0 5.0 - 8.0   Glucose, UA NEGATIVE NEGATIVE mg/dL   Hgb urine dipstick NEGATIVE NEGATIVE   Bilirubin Urine NEGATIVE NEGATIVE   Ketones, ur NEGATIVE NEGATIVE mg/dL   Protein, ur NEGATIVE NEGATIVE mg/dL   Nitrite NEGATIVE NEGATIVE   Leukocytes,Ua NEGATIVE NEGATIVE  CBC     Status: None   Collection Time: 05/29/20  9:29 PM  Result Value Ref Range   WBC 8.9 4.0 - 10.5 K/uL   RBC 4.92 3.87 - 5.11 MIL/uL   Hemoglobin 12.8 12.0 - 15.0 g/dL   HCT 20.2 36 - 46 %   MCV 81.1 80.0 - 100.0 fL   MCH 26.0 26.0 - 34.0 pg   MCHC 32.1 30.0 - 36.0 g/dL   RDW 54.2 70.6 - 23.7 %   Platelets 167 150 - 400 K/uL   nRBC 0.0 0.0 - 0.2 %  Comprehensive metabolic panel     Status: Abnormal   Collection Time: 05/29/20  9:29 PM  Result Value Ref Range   Sodium 133 (L) 135 - 145 mmol/L   Potassium 3.5 3.5 - 5.1 mmol/L   Chloride 102 98 - 111 mmol/L   CO2 21 (L) 22 - 32 mmol/L   Glucose, Bld 97 70 - 99 mg/dL   BUN 5 (L) 6 - 20 mg/dL   Creatinine, Ser 6.28 0.44 - 1.00 mg/dL   Calcium 8.8 (L) 8.9 - 10.3 mg/dL   Total Protein 6.6 6.5 - 8.1 g/dL   Albumin 3.4 (L) 3.5 - 5.0 g/dL   AST 19 15 - 41 U/L   ALT 14 0 - 44 U/L   Alkaline Phosphatase 72 38 - 126 U/L   Total Bilirubin 0.3 0.3 - 1.2 mg/dL   GFR, Estimated >31 >51 mL/min   Anion gap 10 5 - 15  hCG, quantitative, pregnancy     Status: Abnormal   Collection Time: 05/29/20  9:29 PM  Result Value Ref Range   hCG, Beta Chain, Quant, S 75,734 (H) <5 mIU/mL  Wet prep, genital     Status: Abnormal   Collection Time: 05/29/20  9:50 PM  Result Value Ref Range   Yeast Wet Prep HPF POC NONE SEEN NONE SEEN   Trich, Wet Prep NONE SEEN NONE SEEN   Clue Cells Wet Prep HPF POC PRESENT (A) NONE SEEN   WBC, Wet Prep HPF POC FEW (A) NONE SEEN   Sperm NONE SEEN    US OB LESS THAN 14 WEEKS WITH OB TRANSVAGINAL  Result Date: 05/29/2020 CLINICAL DATA:  Initial evaluation for acute spotting, early pregnancy. EXAM: OBSTETRIC <14 WK [place] AND TRANSVAGINAL OB  US TECHNIQUE: Both transabdominal and transvaginal ultrasound examinations were performed for complete evaluation of the gestation as well as the  maternal uterus, adnexal regions, and pelvic cul-de-sac. Transvaginal technique was performed to assess early pregnancy. COMPARISON:  None available. FINDINGS: Intrauterine gestational sac: Single Yolk sac:  Present Embryo:  Present Cardiac Activity: Present Heart Rate: 133 bpm CRL: 6.4 mm   6 w   3 d                  [place] EDC: 01/19/2021 Subchorionic hemorrhage:  None visualized. Maternal uterus/adnexae: Ovaries are normal in appearance bilaterally. No adnexal mass. Trace free fluid noted within pelvis. IMPRESSION: 1. Single viable intrauterine pregnancy as above, estimated gestational age [redacted] weeks and 3 days by crown-rump length, with ultrasound EDC of 01/19/2021. No complication. 2. No other acute maternal uterine or adnexal abnormality identified. Electronically Signed   By: Rise Mu M.D.   On: 05/29/2020 22:39    MAU Course  Procedures  MDM -r/o ectopic -UA: hazy, otherwise WNL -CBC: WNL -CMP: no abnormalities requiring treatment -[place]: single IUP +yolk sac, FHR 133, [redacted]w[redacted]d -hCG: 75,734 -ABO: B Positive -WetPrep: +ClueCells (isolated finding not requiring treatment) -GC/CT collected -pt discharged to home in stable condition  Orders Placed This Encounter  Procedures  . Wet prep, genital    Standing Status:   Standing    Number of Occurrences:   1  . US OB LESS THAN 14 WEEKS WITH OB TRANSVAGINAL    Standing Status:   Standing    Number of Occurrences:   1    Order Specific Question:   Symptom/Reason for Exam    Answer:   Vaginal spotting [[id number]]  . Urinalysis, Routine w reflex microscopic Urine, Clean Catch    Standing Status:   Standing    Number of Occurrences:   1  . CBC    Standing Status:   Standing    Number of Occurrences:   1  . Comprehensive metabolic panel    Standing Status:   Standing    Number of Occurrences:   1   . hCG, quantitative, pregnancy    Standing Status:   Standing    Number of Occurrences:   1  . Discharge patient    Order Specific Question:   Discharge disposition    Answer:   01-Home or Self Care [1]    Order Specific Question:   Discharge patient date    Answer:   05/29/2020   No orders of the defined types were placed in this encounter.   Assessment and Plan   1. Intrauterine pregnancy   2. Vaginal spotting   3. [redacted] weeks gestation of pregnancy   4. Blood type, Rh positive     Allergies as of 05/29/2020   No Known Allergies     Medication List    TAKE these medications   metoCLOPramide 10 MG tablet Commonly known as: REGLAN Take 1 tablet (10 mg total) by mouth every 8 (eight) hours as needed for nausea.       -will call with culture results, if positive -keep appt for [facility] as scheduled at [facility] -safe meds in pregnancy list given -return MAU precautions given -pt discharged to home in stable condition  Joni Reining E Amazing Cowman 05/29/2020, 11:05 PM

## 2020-05-29 NOTE — MAU Note (Signed)
PT SAYS SHE SAW SPOTTING  WHEN SHE WIPED AT 1030AM-  AND FEELING  CRAMPING ALL DAY .   HAS BACK PAIN . Marland Kitchen NO PNC SCHEDULE .

## 2020-05-29 NOTE — Discharge Instructions (Signed)
First Trimester of Pregnancy °The first trimester of pregnancy is from week 1 until the end of week 13 (months 1 through 3). A week after a sperm fertilizes an egg, the egg will implant on the wall of the uterus. This embryo will begin to develop into a baby. Genes from you and your partner will form the baby. The female genes will determine whether the baby will be a boy or a girl. At 6-8 weeks, the eyes and face will be formed, and the heartbeat can be seen on ultrasound. At the end of 12 weeks, all the baby's organs will be formed. °Now that you are pregnant, you will want to do everything you can to have a healthy baby. Two of the most important things are to get good prenatal care and to follow your health care provider's instructions. Prenatal care is all the medical care you receive before the baby's birth. This care will help prevent, find, and treat any problems during the pregnancy and childbirth. °Body changes during your first trimester °Your body goes through many changes during pregnancy. The changes vary from woman to woman. °· You may gain or lose a couple of pounds at first. °· You may feel sick to your stomach (nauseous) and you may throw up (vomit). If the vomiting is uncontrollable, call your health care provider. °· You may tire easily. °· You may develop headaches that can be relieved by medicines. All medicines should be approved by your health care provider. °· You may urinate more often. Painful urination may mean you have a bladder infection. °· You may develop heartburn as a result of your pregnancy. °· You may develop constipation because certain hormones are causing the muscles that push stool through your intestines to slow down. °· You may develop hemorrhoids or swollen veins (varicose veins). °· Your breasts may begin to grow larger and become tender. Your nipples may stick out more, and the tissue that surrounds them (areola) may become darker. °· Your gums may bleed and may be  sensitive to brushing and flossing. °· Dark spots or blotches (chloasma, mask of pregnancy) may develop on your face. This will likely fade after the baby is born. °· Your menstrual periods will stop. °· You may have a loss of appetite. °· You may develop cravings for certain kinds of food. °· You may have changes in your emotions from day to day, such as being excited to be pregnant or being concerned that something may go wrong with the pregnancy and baby. °· You may have more vivid and strange dreams. °· You may have changes in your hair. These can include thickening of your hair, rapid growth, and changes in texture. Some women also have hair loss during or after pregnancy, or hair that feels dry or thin. Your hair will most likely return to normal after your baby is born. °What to expect at prenatal visits °During a routine prenatal visit: °· You will be weighed to make sure you and the baby are growing normally. °· Your blood pressure will be taken. °· Your abdomen will be measured to track your baby's growth. °· The fetal heartbeat will be listened to between weeks 10 and 14 of your pregnancy. °· Test results from any previous visits will be discussed. °Your health care provider may ask you: °· How you are feeling. °· If you are feeling the baby move. °· If you have had any abnormal symptoms, such as leaking fluid, bleeding, severe headaches, or abdominal   cramping. °· If you are using any tobacco products, including cigarettes, chewing tobacco, and electronic cigarettes. °· If you have any questions. °Other tests that may be performed during your first trimester include: °· Blood tests to find your blood type and to check for the presence of any previous infections. The tests will also be used to check for low iron levels (anemia) and protein on red blood cells (Rh antibodies). Depending on your risk factors, or if you previously had diabetes during pregnancy, you may have tests to check for high blood sugar  that affects pregnant women (gestational diabetes). °· Urine tests to check for infections, diabetes, or protein in the urine. °· An ultrasound to confirm the proper growth and development of the baby. °· Fetal screens for spinal cord problems (spina bifida) and Down syndrome. °· HIV (human immunodeficiency virus) testing. Routine prenatal testing includes screening for HIV, unless you choose not to have this test. °· You may need other tests to make sure you and the baby are doing well. °Follow these instructions at home: °Medicines °· Follow your health care provider's instructions regarding medicine use. Specific medicines may be either safe or unsafe to take during pregnancy. °· Take a prenatal vitamin that contains at least 600 micrograms (mcg) of folic acid. °· If you develop constipation, try taking a stool softener if your health care provider approves. °Eating and drinking ° °· Eat a balanced diet that includes fresh fruits and vegetables, whole grains, good sources of protein such as meat, eggs, or tofu, and low-fat dairy. Your health care provider will help you determine the amount of weight gain that is right for you. °· Avoid raw meat and uncooked cheese. These carry germs that can cause birth defects in the baby. °· Eating four or five small meals rather than three large meals a day may help relieve nausea and vomiting. If you start to feel nauseous, eating a few soda crackers can be helpful. Drinking liquids between meals, instead of during meals, also seems to help ease nausea and vomiting. °· Limit foods that are high in fat and processed sugars, such as fried and sweet foods. °· To prevent constipation: °? Eat foods that are high in fiber, such as fresh fruits and vegetables, whole grains, and beans. °? Drink enough fluid to keep your urine clear or pale yellow. °Activity °· Exercise only as directed by your health care provider. Most women can continue their usual exercise routine during  pregnancy. Try to exercise for 30 minutes at least 5 days a week. Exercising will help you: °? Control your weight. °? Stay in shape. °? Be prepared for labor and delivery. °· Experiencing pain or cramping in the lower abdomen or lower back is a good sign that you should stop exercising. Check with your health care provider before continuing with normal exercises. °· Try to avoid standing for long periods of time. Move your legs often if you must stand in one place for a long time. °· Avoid heavy lifting. °· Wear low-heeled shoes and practice good posture. °· You may continue to have sex unless your health care provider tells you not to. °Relieving pain and discomfort °· Wear a good support bra to relieve breast tenderness. °· Take warm sitz baths to soothe any pain or discomfort caused by hemorrhoids. Use hemorrhoid cream if your health care provider approves. °· Rest with your legs elevated if you have leg cramps or low back pain. °· If you develop varicose veins in   your legs, wear support hose. Elevate your feet for 15 minutes, 3-4 times a day. Limit salt in your diet. Prenatal care  Schedule your prenatal visits by the twelfth week of pregnancy. They are usually scheduled monthly at first, then more often in the last 2 months before delivery.  Write down your questions. Take them to your prenatal visits.  Keep all your prenatal visits as told by your health care provider. This is important. Safety  Wear your seat belt at all times when driving.  Make a list of emergency phone numbers, including numbers for family, friends, the hospital, and police and fire departments. General instructions  Ask your health care provider for a referral to a local prenatal education class. Begin classes no later than the beginning of month 6 of your pregnancy.  Ask for help if you have counseling or nutritional needs during pregnancy. Your health care provider can offer advice or refer you to specialists for help  with various needs.  Do not use hot tubs, steam rooms, or saunas.  Do not douche or use tampons or scented sanitary pads.  Do not cross your legs for long periods of time.  Avoid cat litter boxes and soil used by cats. These carry germs that can cause birth defects in the baby and possibly loss of the fetus by miscarriage or stillbirth.  Avoid all smoking, herbs, alcohol, and medicines not prescribed by your health care provider. Chemicals in these products affect the formation and growth of the baby.  Do not use any products that contain nicotine or tobacco, such as cigarettes and e-cigarettes. If you need help quitting, ask your health care provider. You may receive counseling support and other resources to help you quit.  Schedule a dentist appointment. At home, brush your teeth with a soft toothbrush and be gentle when you floss. Contact a health care provider if:  You have dizziness.  You have mild pelvic cramps, pelvic pressure, or nagging pain in the abdominal area.  You have persistent nausea, vomiting, or diarrhea.  You have a bad smelling vaginal discharge.  You have pain when you urinate.  You notice increased swelling in your face, hands, legs, or ankles.  You are exposed to fifth disease or chickenpox.  You are exposed to [place] measles (rubella) and have never had it. Get help right away if:  You have a fever.  You are leaking fluid from your vagina.  You have spotting or bleeding from your vagina.  You have severe abdominal cramping or pain.  You have rapid weight gain or loss.  You vomit blood or material that looks like coffee grounds.  You develop a severe headache.  You have shortness of breath.  You have any kind of trauma, such as from a fall or a car accident. Summary  The first trimester of pregnancy is from week 1 until the end of week 13 (months 1 through 3).  Your body goes through many changes during pregnancy. The changes vary from  woman to woman.  You will have routine prenatal visits. During those visits, your health care provider will examine you, discuss any test results you may have, and talk with you about how you are feeling. This information is not intended to replace advice given to you by your health care provider. Make sure you discuss any questions you have with your health care provider. Document Revised: 07/21/2017 Document Reviewed: 07/20/2016 Elsevier Patient Education  2020 [employer].  Vaginal Bleeding During Pregnancy, First Trimester  A small amount of bleeding from the vagina (spotting) is relatively common during early pregnancy. It usually stops on its own. Various things may cause bleeding or spotting during early pregnancy. Some bleeding may be related to the pregnancy, and some may not. In many cases, the bleeding is normal and is not a problem. However, bleeding can also be a sign of something serious. Be sure to tell your health care provider about any vaginal bleeding right away. Some possible causes of vaginal bleeding during the first trimester include:  Infection or inflammation of the cervix.  Growths (polyps) on the cervix.  Miscarriage or threatened miscarriage.  Pregnancy tissue developing outside of the uterus (ectopic pregnancy).  A mass of tissue developing in the uterus due to an egg being fertilized incorrectly (molar pregnancy). Follow these instructions at home: Activity  Follow instructions from your health care provider about limiting your activity. Ask what activities are safe for you.  If needed, make plans for someone to help with your regular activities.  Do not have sex or orgasms until your health care provider says that this is safe. General instructions  Take over-the-counter and prescription medicines only as told by your health care provider.  Pay attention to any changes in your symptoms.  Do not use tampons or douche.  Write down how  many pads you use each day, how often you change pads, and how soaked (saturated) they are.  If you pass any tissue from your vagina, save the tissue so you can show it to your health care provider.  Keep all follow-up visits as told by your health care provider. This is important. Contact a health care provider if:  You have vaginal bleeding during any part of your pregnancy.  You have cramps or labor pains.  You have a fever. Get help right away if:  You have severe cramps in your back or abdomen.  You pass large clots or a large amount of tissue from your vagina.  Your bleeding increases.  You feel light-headed or weak, or you faint.  You have chills.  You are leaking fluid or have a gush of fluid from your vagina. Summary  A small amount of bleeding (spotting) from the vagina is relatively common during early pregnancy.  Various things may cause bleeding or spotting in early pregnancy.  Be sure to tell your health care provider about any vaginal bleeding right away. This information is not intended to replace advice given to you by your health care provider. Make sure you discuss any questions you have with your health care provider. Document Revised: 11/27/2018 Document Reviewed: 11/10/2016 Elsevier Patient Education  2020 [employer] Inc[employer]                        Safe Medications in Pregnancy    Acne: Benzoyl Peroxide Salicylic Acid  Backache/Headache: Tylenol: 2 regular strength every 4 hours OR              2 Extra strength every 6 hours  Colds/Coughs/Allergies: Benadryl (alcohol free) 25 mg every 6 hours as needed Breath right strips Claritin Cepacol throat lozenges Chloraseptic throat spray Cold-Eeze- up to three times per day Cough drops, alcohol free Flonase (by prescription only) Guaifenesin Mucinex Robitussin DM (plain only, alcohol free) Saline nasal spray/drops Sudafed (pseudoephedrine) & Actifed ** use only after [redacted] weeks gestation and if  you do not have high blood pressure Tylenol Vicks Vaporub Zinc  lozenges Zyrtec   Constipation: Colace Ducolax suppositories Fleet enema Glycerin suppositories Metamucil Milk of magnesia Miralax Senokot Smooth move tea  Diarrhea: Kaopectate Imodium A-D  *NO pepto Bismol  Hemorrhoids: Anusol Anusol HC Preparation H Tucks  Indigestion: Tums Maalox Mylanta Zantac  Pepcid  Insomnia: Benadryl (alcohol free) 25mg  every 6 hours as needed Tylenol PM Unisom, no Gelcaps  Leg Cramps: Tums MagGel  Nausea/Vomiting:  Bonine Dramamine Emetrol Ginger extract Sea bands Meclizine  Nausea medication to take during pregnancy:  Unisom (doxylamine succinate 25 mg tablets) Take one tablet daily at bedtime. If symptoms are not adequately controlled, the dose can be increased to a maximum recommended dose of two tablets daily (1/2 tablet in the morning, 1/2 tablet mid-afternoon and one at bedtime). Vitamin B6 100mg  tablets. Take one tablet twice a day (up to 200 mg per day).  Skin Rashes: Aveeno products Benadryl cream or 25mg  every 6 hours as needed Calamine Lotion 1% cortisone cream  Yeast infection: Gyne-lotrimin 7 Monistat 7   **If taking multiple medications, please check labels to avoid duplicating the same active ingredients **take medication as directed on the label ** Do not exceed 4000 mg of tylenol in 24 hours **Do not take medications that contain aspirin or ibuprofen

## 2020-06-01 LAB — GC/CHLAMYDIA PROBE AMP (~~LOC~~) NOT AT ARMC
Chlamydia: NEGATIVE
Comment: NEGATIVE
Comment: NORMAL
Neisseria Gonorrhea: NEGATIVE

## 2020-06-11 ENCOUNTER — Encounter: Payer: Self-pay | Admitting: Certified Nurse Midwife

## 2020-06-29 ENCOUNTER — Encounter: Payer: Self-pay | Admitting: Advanced Practice Midwife

## 2021-05-06 ENCOUNTER — Telehealth: Payer: Medicaid Other | Admitting: Family Medicine

## 2021-05-06 DIAGNOSIS — K047 Periapical abscess without sinus: Secondary | ICD-10-CM | POA: Diagnosis not present

## 2021-05-06 MED ORDER — AMOXICILLIN 500 MG PO CAPS: 500.0000 mg | ORAL_CAPSULE | Freq: Three times a day (TID) | ORAL | 0 refills | Status: AC

## 2021-05-06 MED ORDER — IBUPROFEN 600 MG PO TABS: 600.0000 mg | ORAL_TABLET | Freq: Three times a day (TID) | ORAL | 0 refills | Status: AC | PRN

## 2021-05-06 NOTE — Progress Notes (Signed)

## 2022-05-11 ENCOUNTER — Ambulatory Visit (INDEPENDENT_AMBULATORY_CARE_PROVIDER_SITE_OTHER): Payer: Medicaid Other | Admitting: General Practice

## 2022-05-11 DIAGNOSIS — Z3201 Encounter for pregnancy test, result positive: Secondary | ICD-10-CM | POA: Diagnosis not present

## 2022-05-11 DIAGNOSIS — Z3200 Encounter for pregnancy test, result unknown: Secondary | ICD-10-CM

## 2022-05-11 LAB — POCT URINE PREGNANCY: Preg Test, Ur: POSITIVE — AB

## 2022-05-11 NOTE — Progress Notes (Signed)
Patient was assessed and managed by nursing staff during this encounter. I have reviewed the chart and agree with the documentation and plan. I have also made any necessary editorial changes.  Darwin Rothlisberger A Raymona Boss, MD 05/11/2022 4:57 PM   

## 2022-05-11 NOTE — Progress Notes (Signed)
Grace Harper presents today for UPT. She has no unusual complaints. LMP: 04-14-22    OBJECTIVE: Appears well, in no apparent distress.  OB History     Gravida  2   Para  1   Term  1   Preterm  0   AB  0   Living  1      SAB  0   IAB      Ectopic  0   Multiple  0   Live Births  1          Home UPT Result: Positive x4 In-Office UPT result: Positive I have reviewed the patient's medical, obstetrical, social, and family histories, and medications.   ASSESSMENT: Positive pregnancy test  PLAN Prenatal care to be completed at: [facility]

## 2022-06-06 ENCOUNTER — Other Ambulatory Visit: Payer: Self-pay

## 2022-06-06 DIAGNOSIS — O219 Vomiting of pregnancy, unspecified: Secondary | ICD-10-CM

## 2022-06-06 MED ORDER — PROMETHAZINE HCL 25 MG PO TABS: 25.0000 mg | ORAL_TABLET | Freq: Four times a day (QID) | ORAL | 2 refills | Status: DC | PRN

## 2022-06-20 ENCOUNTER — Encounter: Payer: Medicaid Other | Admitting: Advanced Practice Midwife

## 2022-08-24 IMAGING — US US OB < 14 WEEKS - US OB TV
1 series · 15 of 28 positions shown · non-contrast
Comparison: None available.

CLINICAL DATA: Initial evaluation for acute spotting, early
pregnancy.

EXAM:
OBSTETRIC <14 WK US AND TRANSVAGINAL OB US
TECHNIQUE: Both transabdominal and transvaginal ultrasound examinations were
performed for complete evaluation of the gestation as well as the
maternal uterus, adnexal regions, and pelvic cul-de-sac.
Transvaginal technique was performed to assess early pregnancy.

[Series 1: us ob < 14 weeks - us ob tv · 15 of 73 slices shown]
[im 1/73]
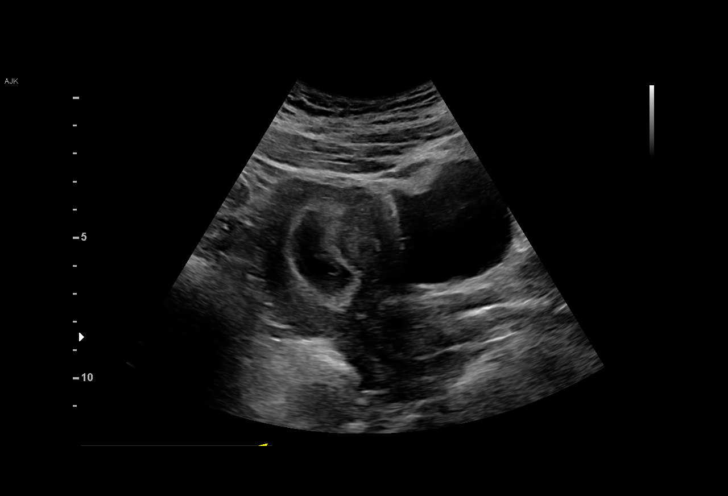
[im 6/73]
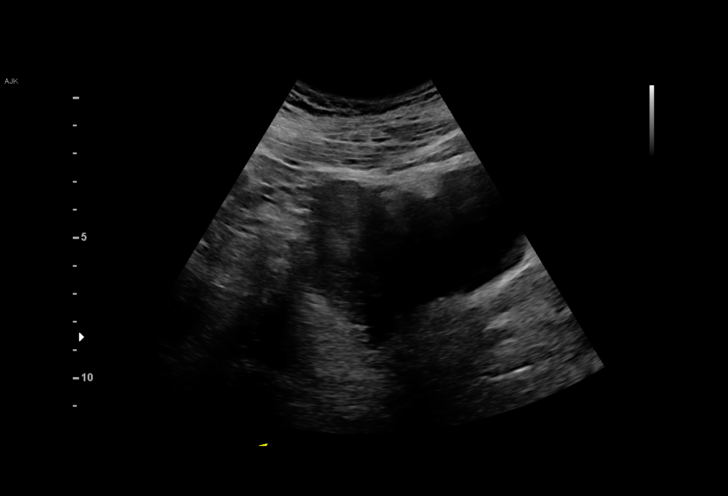
[im 11/73]
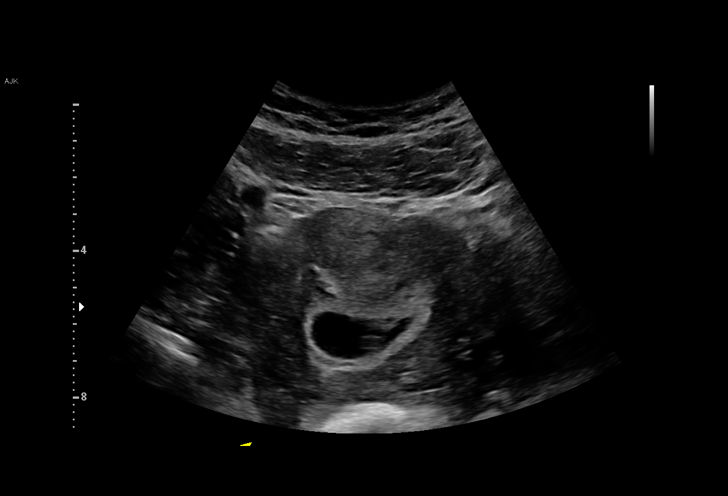
[im 17/73]
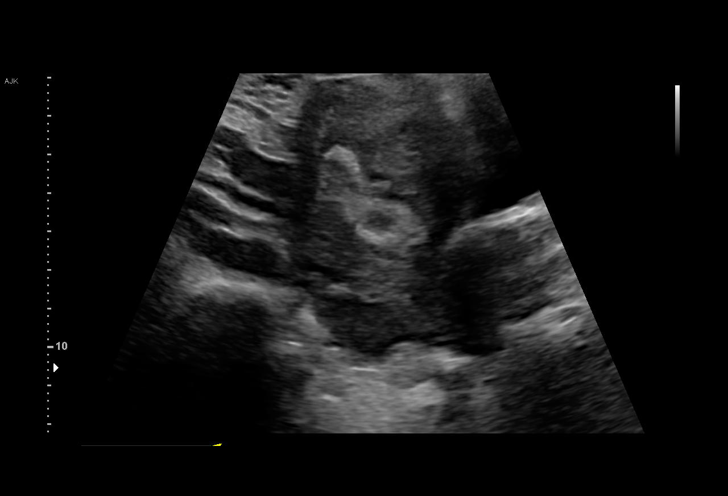
[im 22/73]
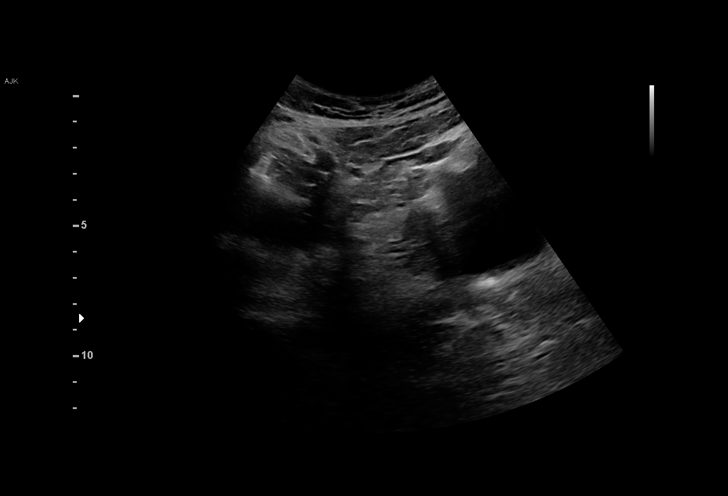
[im 27/73]
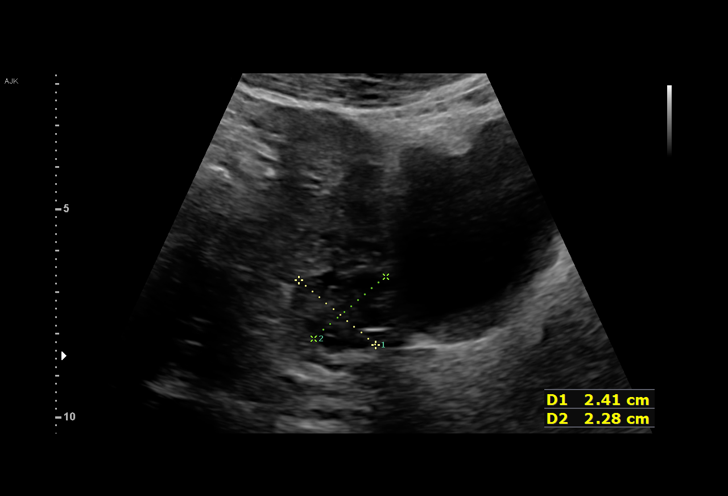
[im 33/73]
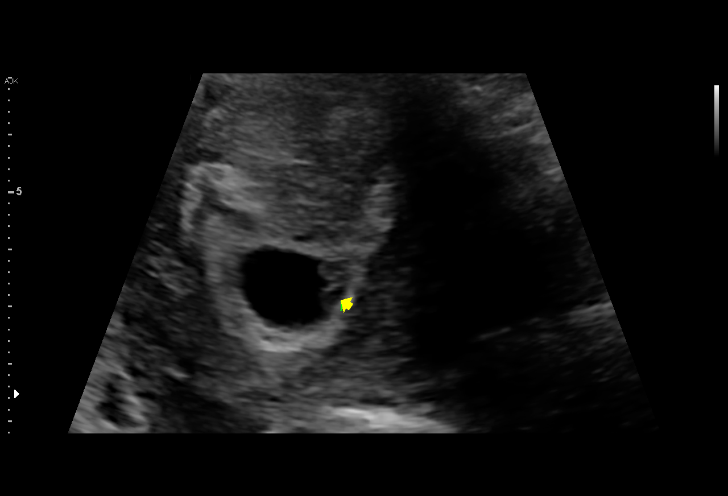
[im 38/73]
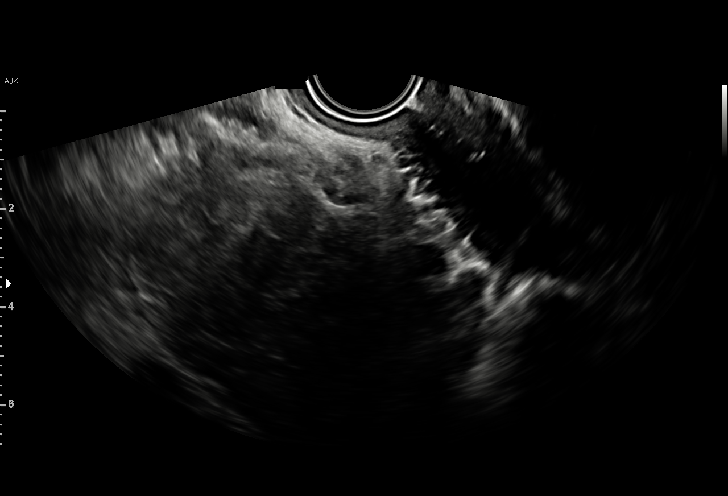
[im 41/73]
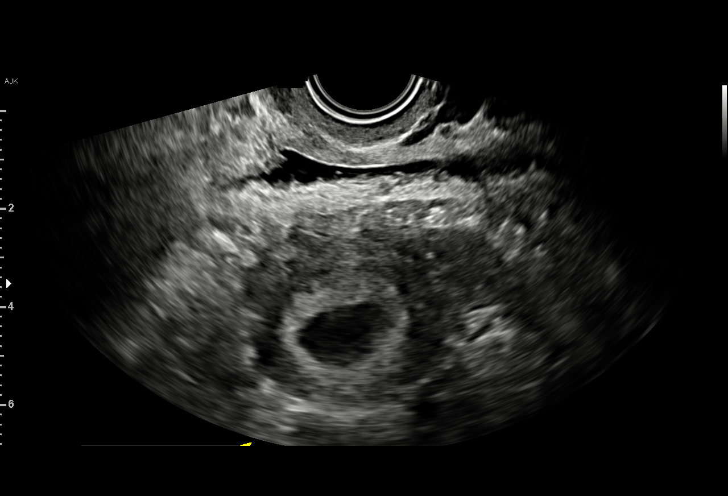
[im 46/73]
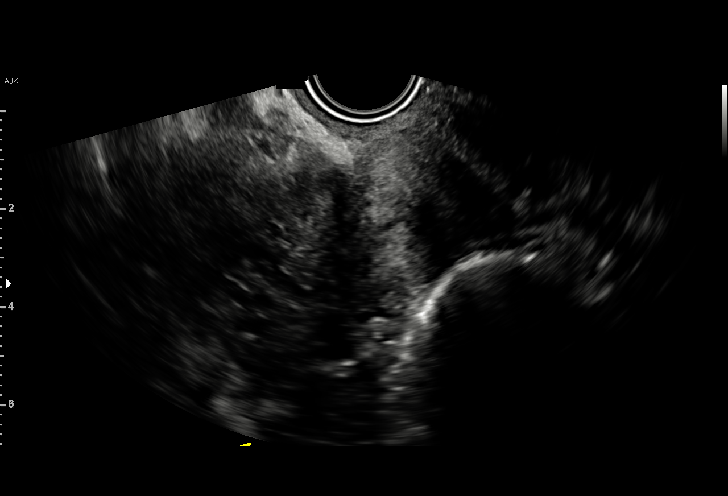
[im 51/73]
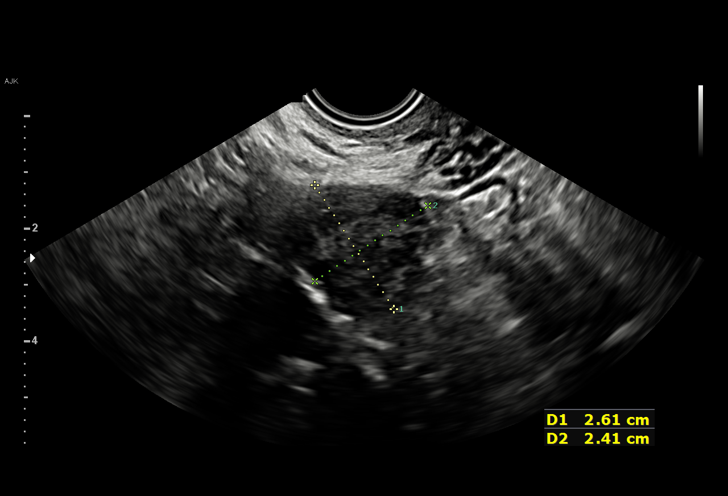
[im 57/73]
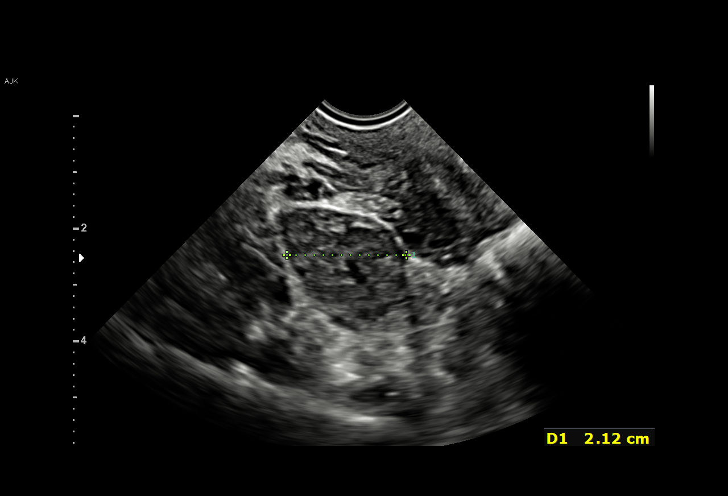
[im 62/73]
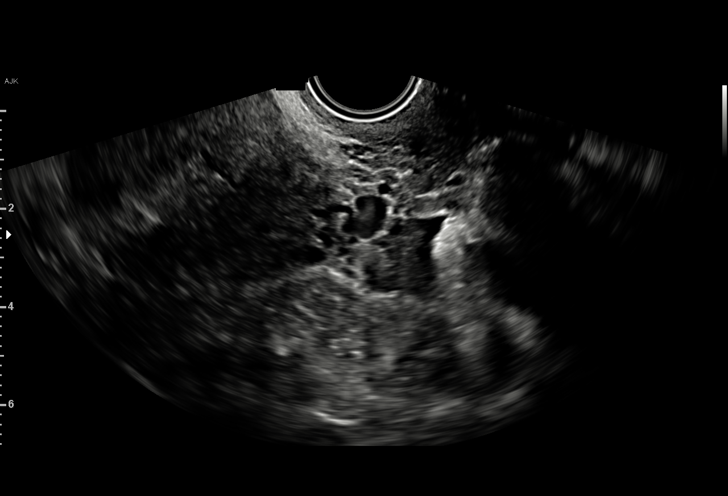
[im 67/73]
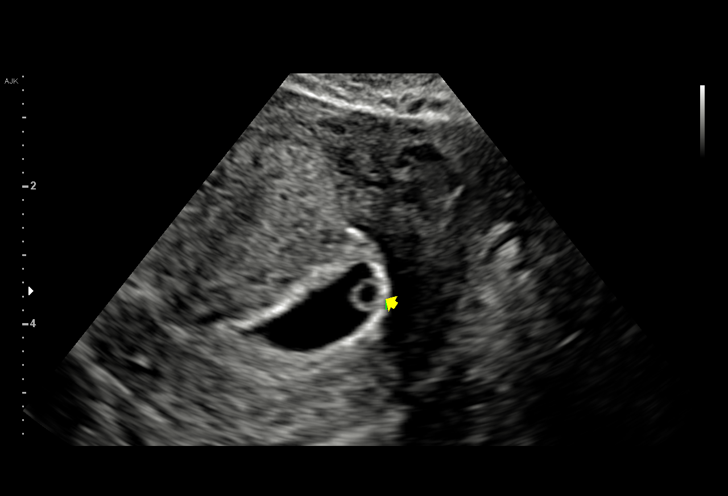
[im 73/73]
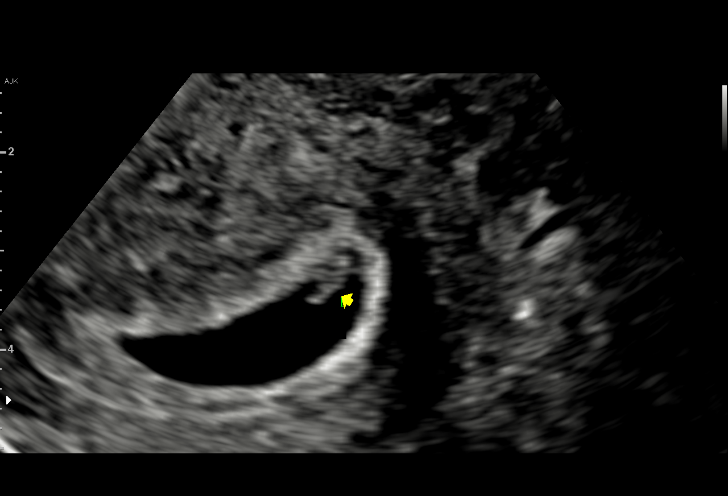

[15 of 28 positions shown; findings below may reference images not displayed]

FINDINGS: Intrauterine gestational sac: Single

Yolk sac:  Present

Embryo:  Present

Cardiac Activity: Present

Heart Rate: 133 bpm

CRL: 6.4 mm   6 w   3 d                  US EDC: 01/19/2021

Subchorionic hemorrhage:  None visualized.

Maternal uterus/adnexae: Ovaries are normal in appearance
bilaterally. No adnexal mass. Trace free fluid noted within pelvis.
IMPRESSION: 1. Single viable intrauterine pregnancy as above, estimated
gestational age 6 weeks and 3 days by crown-rump length, with
ultrasound EDC of 01/19/2021. No complication.
2. No other acute maternal uterine or adnexal abnormality
identified.

## 2023-04-05 DIAGNOSIS — Z3141 Encounter for fertility testing: Secondary | ICD-10-CM | POA: Diagnosis not present

## 2023-04-05 DIAGNOSIS — Z13220 Encounter for screening for lipoid disorders: Secondary | ICD-10-CM | POA: Diagnosis not present

## 2023-04-05 DIAGNOSIS — Z113 Encounter for screening for infections with a predominantly sexual mode of transmission: Secondary | ICD-10-CM | POA: Diagnosis not present

## 2023-04-05 DIAGNOSIS — Z131 Encounter for screening for diabetes mellitus: Secondary | ICD-10-CM | POA: Diagnosis not present

## 2023-05-02 ENCOUNTER — Ambulatory Visit (INDEPENDENT_AMBULATORY_CARE_PROVIDER_SITE_OTHER): Payer: BC Managed Care – PPO | Admitting: Emergency Medicine

## 2023-05-02 VITALS — BP 120/65 | HR 72 | Ht 60.0 in | Wt 187.4 lb

## 2023-05-02 DIAGNOSIS — N912 Amenorrhea, unspecified: Secondary | ICD-10-CM

## 2023-05-02 DIAGNOSIS — Z3201 Encounter for pregnancy test, result positive: Secondary | ICD-10-CM | POA: Diagnosis not present

## 2023-05-02 DIAGNOSIS — Z3200 Encounter for pregnancy test, result unknown: Secondary | ICD-10-CM

## 2023-05-02 LAB — POCT URINE PREGNANCY: Preg Test, Ur: POSITIVE — AB

## 2023-05-02 MED ORDER — PREPLUS 27-1 MG PO TABS: 1.0000 | ORAL_TABLET | Freq: Every day | ORAL | 13 refills | Status: DC

## 2023-05-02 NOTE — Progress Notes (Cosign Needed Addendum)
[name] presents today for UPT. She has no unusual complaints and complains of abdominal pain in the right upper quadrant and nausea with vomiting for 12 days. LMP: 03/23/23    OBJECTIVE: Appears well, in no apparent distress.  OB History     Gravida  3   Para  1   Term  1   Preterm  0   AB  1   Living  1      SAB  0   IAB  1   Ectopic  0   Multiple  0   Live Births  1          Home UPT Result: Positive  In-Office UPT result:Positive I have reviewed the patient's medical, obstetrical, social, and family histories, and medications.   ASSESSMENT: Positive pregnancy test  PLAN Prenatal care to be completed at: [facility]  Pt advised to present to MAU for evaluation of Right sided abd pain, cramping that is worsening.

## 2023-05-08 ENCOUNTER — Other Ambulatory Visit: Payer: Self-pay

## 2023-05-08 DIAGNOSIS — O219 Vomiting of pregnancy, unspecified: Secondary | ICD-10-CM

## 2023-05-08 MED ORDER — PROMETHAZINE HCL 25 MG PO TABS: 25.0000 mg | ORAL_TABLET | Freq: Four times a day (QID) | ORAL | 2 refills | Status: DC | PRN

## 2023-06-05 ENCOUNTER — Other Ambulatory Visit: Payer: Self-pay

## 2023-06-05 DIAGNOSIS — O219 Vomiting of pregnancy, unspecified: Secondary | ICD-10-CM

## 2023-06-05 DIAGNOSIS — Z3480 Encounter for supervision of other normal pregnancy, unspecified trimester: Secondary | ICD-10-CM

## 2023-06-05 MED ORDER — DOXYLAMINE-PYRIDOXINE 10-10 MG PO TBEC: 2.0000 | DELAYED_RELEASE_TABLET | Freq: Every day | ORAL | 5 refills | Status: DC

## 2023-06-05 MED ORDER — VITAFOL GUMMIES 3.33-0.333-34.8 MG PO CHEW: 3.0000 | CHEWABLE_TABLET | Freq: Every day | ORAL | 11 refills | Status: DC

## 2023-06-12 ENCOUNTER — Other Ambulatory Visit (HOSPITAL_COMMUNITY)
Admission: RE | Admit: 2023-06-12 | Discharge: 2023-06-12 | Disposition: A | Discharge status: Home / Self Care | Payer: BC Managed Care – PPO | Source: Ambulatory Visit | Attending: Obstetrics and Gynecology | Admitting: Obstetrics and Gynecology

## 2023-06-12 ENCOUNTER — Other Ambulatory Visit (INDEPENDENT_AMBULATORY_CARE_PROVIDER_SITE_OTHER): Payer: BC Managed Care – PPO

## 2023-06-12 ENCOUNTER — Ambulatory Visit: Payer: BC Managed Care – PPO | Admitting: *Deleted

## 2023-06-12 VITALS — BP 119/75 | HR 86 | Wt 184.8 lb

## 2023-06-12 DIAGNOSIS — O0990 Supervision of high risk pregnancy, unspecified, unspecified trimester: Secondary | ICD-10-CM | POA: Diagnosis not present

## 2023-06-12 DIAGNOSIS — O3680X0 Pregnancy with inconclusive fetal viability, not applicable or unspecified: Secondary | ICD-10-CM

## 2023-06-12 DIAGNOSIS — Z1339 Encounter for screening examination for other mental health and behavioral disorders: Secondary | ICD-10-CM

## 2023-06-12 DIAGNOSIS — Z3A11 11 weeks gestation of pregnancy: Secondary | ICD-10-CM

## 2023-06-12 DIAGNOSIS — O0991 Supervision of high risk pregnancy, unspecified, first trimester: Secondary | ICD-10-CM

## 2023-06-12 MED ORDER — ASPIRIN 81 MG PO TBEC: 81.0000 mg | DELAYED_RELEASE_TABLET | Freq: Every day | ORAL | 12 refills | Status: DC

## 2023-06-12 MED ORDER — BLOOD PRESSURE KIT DEVI: 1.0000 | 0 refills | Status: DC

## 2023-06-12 NOTE — Progress Notes (Deleted)
    Post Partum Visit Note  Grace Harper is a 27 y.o. G42P1021 female who presents for a postpartum visit. She is {1-10:13787} {time; units:18646} postpartum following a {method of delivery:313099}.  I have fully reviewed the prenatal and intrapartum course. The delivery was at *** gestational weeks.  Anesthesia: {anesthesia types:812}. Postpartum course has been ***. Baby is doing well***. Baby is feeding by {breastmilk/bottle:69}. Bleeding {vag bleed:12292}. Bowel function is {normal:32111}. Bladder function is {normal:32111}. Patient {is/is not:9024} sexually active. Contraception method is {contraceptive method:5051}. Postpartum depression screening: {gen negative/positive:315881}.   The pregnancy intention screening data noted above was reviewed. Potential methods of contraception were discussed. The patient elected to proceed with No data recorded.    Health Maintenance Due  Topic Date Due   Hepatitis C Screening  Never done   Cervical Cancer Screening (Pap smear)  Never done   INFLUENZA VACCINE  03/23/2023   COVID-19 Vaccine (1 - 2023-24 season) Never done   DTaP/Tdap/Td (9 - Td or Tdap) 05/12/2023    {Common ambulatory SmartLinks:19316}  Review of Systems {ros; complete:30496}  Objective:  LMP 03/23/2023    General:  {gen appearance:16600}   Breasts:  {desc; normal/abnormal/not indicated:14647}  Lungs: {lung exam:16931}  Heart:  {heart exam:5510}  Abdomen: {abdomen exam:16834}   Wound {Wound assessment:11097}  GU exam:  {desc; normal/abnormal/not indicated:14647}       Assessment:    1. Supervision of high risk pregnancy, antepartum ***  2. Encounter to determine fetal viability of pregnancy, single or unspecified fetus ***   *** postpartum exam.   Plan:   Essential components of care per ACOG recommendations:  1.  Mood and well being: Patient with {gen negative/positive:315881} depression screening today. Reviewed local resources for support.  - Patient  tobacco use? {tobacco use:25506}  - hx of drug use? {yes/no:25505}    2. Infant care and feeding:  -Patient currently breastmilk feeding? {yes/no:25502}  -Social determinants of health (SDOH) reviewed in EPIC. No concerns***The following needs were identified***  3. Sexuality, contraception and birth spacing - Patient {DOES_DOES MVH:84696} want a pregnancy in the next year.  Desired family size is {NUMBER 1-10:22536} children.  - Reviewed reproductive life planning. Reviewed contraceptive methods based on pt preferences and effectiveness.  Patient desired {Upstream End Methods:24109} today.   - Discussed birth spacing of 18 months  4. Sleep and fatigue -Encouraged family/partner/community support of 4 hrs of uninterrupted sleep to help with mood and fatigue  5. Physical Recovery  - Discussed patients delivery and complications. She describes her labor as {description:25511} - Patient had a {CHL AMB DELIVERY:[phone]}. Patient had a {laceration:25518} laceration. Perineal healing reviewed. Patient expressed understanding - Patient has urinary incontinence? {yes/no:25515} - Patient {ACTION; IS/IS EXB:28413244} safe to resume physical and sexual activity  6.  Health Maintenance - HM due items addressed {Yes or If no, why not?:20788} - Last pap smear No results found for: "DIAGPAP" Pap smear {done:10129} at today's visit.  -Breast Cancer screening indicated? {indicated:25516}  7. Chronic Disease/Pregnancy Condition follow up: {Follow up:25499}  - PCP follow up  Harrel Lemon, RN Center for [employer], [facility]

## 2023-06-12 NOTE — Progress Notes (Signed)
New OB Intake  I connected with Grace Harper  on 06/12/23 at  9:15 AM EDT by In Person Visit and verified that I am speaking with the correct person using two identifiers. Nurse is located at [facility] and pt is located at [place].  I discussed the limitations, risks, security and privacy concerns of performing an evaluation and management service by telephone and the availability of in person appointments. I also discussed with the patient that there may be a patient responsible charge related to this service. The patient expressed understanding and agreed to proceed.  I explained I am completing New OB Intake today. We discussed EDD of 12/28/2023, by Last Menstrual Period. Pt is G4P1021. I reviewed her allergies, medications and Medical/Surgical/OB history.    Patient Active Problem List   Diagnosis Date Noted   Supervision of high risk pregnancy, antepartum 06/12/2023   IUD check up 01/13/2016   Prediabetes 09/16/2015   Hyperlipidemia 09/16/2015   Weight gain 09/04/2015   Generalized headaches 09/04/2015   Teenage mother 06/11/2013   Contraception management 06/11/2013    Concerns addressed today  Delivery Plans Plans to deliver at [facility]. Discussed the nature of our practice with multiple providers including residents and students. Due to the size of the practice, the delivering provider may not be the same as those providing prenatal care.   Patient Is Not a Candidate for water birth. Offered upcoming OB visit with CNM to discuss further.  MyChart/Babyscripts MyChart access verified. I explained pt will have some visits in office and some virtually. Babyscripts instructions given and order placed. Patient verifies receipt of registration text/e-mail. Account successfully created and app downloaded.  Blood Pressure Cuff/Weight Scale Blood pressure cuff ordered for patient to pick-up from [employer]. Explained after first prenatal appt pt will check weekly and document in  Babyscripts. Patient does not have weight scale; patient may purchase if they desire to track weight weekly in Babyscripts.  Anatomy US Explained first scheduled [place] will be around 19 weeks. Anatomy US scheduled for TBD at TBD.  Interested in [place]? If yes, send referral and doula dot phrase.   Is patient a candidate for Babyscripts Optimization? No - High Risk  First visit review I reviewed new OB appt with patient. Explained pt will be seen by Dr. Judd Lien at first visit. Discussed Avelina Laine genetic screening with patient. Requests Panorama and Horizon.. Routine prenatal labs  collected at today's visit.    Last Pap No results found for: "DIAGPAP"  Harrel Lemon, RN 06/12/2023  1:05 PM

## 2023-06-12 NOTE — Patient Instructions (Signed)

## 2023-06-13 LAB — CERVICOVAGINAL ANCILLARY ONLY
Chlamydia: NEGATIVE
Comment: NEGATIVE
Comment: NORMAL
Neisseria Gonorrhea: NEGATIVE

## 2023-06-13 LAB — CBC/D/PLT+RPR+RH+ABO+RUBIGG...
Antibody Screen: NEGATIVE
Basophils Absolute: 0 10*3/uL (ref 0.0–0.2)
Basos: 1 %
EOS (ABSOLUTE): 0.5 10*3/uL — ABNORMAL HIGH (ref 0.0–0.4)
Eos: 8 %
HCV Ab: NONREACTIVE
HIV Screen 4th Generation wRfx: NONREACTIVE
Hematocrit: 41.6 % (ref 34.0–46.6)
Hemoglobin: 13.1 g/dL (ref 11.1–15.9)
Hepatitis B Surface Ag: NEGATIVE
Immature Grans (Abs): 0 10*3/uL (ref 0.0–0.1)
Immature Granulocytes: 0 %
Lymphocytes Absolute: 1.6 10*3/uL (ref 0.7–3.1)
Lymphs: 25 %
MCH: 26.2 pg — ABNORMAL LOW (ref 26.6–33.0)
MCHC: 31.5 g/dL (ref 31.5–35.7)
MCV: 83 fL (ref 79–97)
Monocytes Absolute: 0.5 10*3/uL (ref 0.1–0.9)
Monocytes: 7 %
Neutrophils Absolute: 3.8 10*3/uL (ref 1.4–7.0)
Neutrophils: 59 %
Platelets: 176 10*3/uL (ref 150–450)
RBC: 5 x10E6/uL (ref 3.77–5.28)
RDW: 14.2 % (ref 11.7–15.4)
RPR Ser Ql: NONREACTIVE
Rh Factor: POSITIVE
Rubella Antibodies, IGG: 11.4 {index} (ref >0.99)
WBC: 6.4 10*3/uL (ref 3.4–10.8)

## 2023-06-13 LAB — COMPREHENSIVE METABOLIC PANEL
ALT: 15 [IU]/L (ref 0–32)
AST: 16 [IU]/L (ref 0–40)
Albumin: 4.1 g/dL (ref 4.0–5.0)
Alkaline Phosphatase: 59 [IU]/L (ref 44–121)
BUN/Creatinine Ratio: 16 (ref 9–23)
BUN: 11 mg/dL (ref 6–20)
Bilirubin Total: <0.2 mg/dL (ref 0.0–1.2)
CO2: 21 mmol/L (ref 20–29)
Calcium: 9.3 mg/dL (ref 8.7–10.2)
Chloride: 102 mmol/L (ref 96–106)
Creatinine, Ser: 0.68 mg/dL (ref 0.57–1.00)
Globulin, Total: 2.7 g/dL (ref 1.5–4.5)
Glucose: 75 mg/dL (ref 70–99)
Potassium: 4.4 mmol/L (ref 3.5–5.2)
Sodium: 137 mmol/L (ref 134–144)
Total Protein: 6.8 g/dL (ref 6.0–8.5)
eGFR: 122 mL/min/{1.73_m2} (ref >59)

## 2023-06-13 LAB — HEMOGLOBIN A1C
Est. average glucose Bld gHb Est-mCnc: 111 mg/dL
Hgb A1c MFr Bld: 5.5 % (ref 4.8–5.6)

## 2023-06-13 LAB — HCV INTERPRETATION

## 2023-06-14 LAB — CULTURE, OB URINE

## 2023-06-14 LAB — URINE CULTURE, OB REFLEX

## 2023-06-18 LAB — PANORAMA PRENATAL TEST FULL PANEL:PANORAMA TEST PLUS 5 ADDITIONAL MICRODELETIONS: FETAL FRACTION: 8.9

## 2023-06-20 ENCOUNTER — Ambulatory Visit (INDEPENDENT_AMBULATORY_CARE_PROVIDER_SITE_OTHER): Payer: Medicaid Other | Admitting: Family Medicine

## 2023-06-20 ENCOUNTER — Other Ambulatory Visit (HOSPITAL_COMMUNITY)
Admission: RE | Admit: 2023-06-20 | Discharge: 2023-06-20 | Disposition: A | Discharge status: Home / Self Care | Payer: BC Managed Care – PPO | Source: Ambulatory Visit | Attending: Family Medicine | Admitting: Family Medicine

## 2023-06-20 VITALS — BP 105/61 | HR 77 | Wt 182.4 lb

## 2023-06-20 DIAGNOSIS — Z3A12 12 weeks gestation of pregnancy: Secondary | ICD-10-CM | POA: Insufficient documentation

## 2023-06-20 DIAGNOSIS — Z98891 History of uterine scar from previous surgery: Secondary | ICD-10-CM | POA: Diagnosis not present

## 2023-06-20 DIAGNOSIS — O09292 Supervision of pregnancy with other poor reproductive or obstetric history, second trimester: Secondary | ICD-10-CM | POA: Diagnosis not present

## 2023-06-20 DIAGNOSIS — O09299 Supervision of pregnancy with other poor reproductive or obstetric history, unspecified trimester: Secondary | ICD-10-CM | POA: Insufficient documentation

## 2023-06-20 DIAGNOSIS — Z3480 Encounter for supervision of other normal pregnancy, unspecified trimester: Secondary | ICD-10-CM | POA: Insufficient documentation

## 2023-06-20 DIAGNOSIS — Z3481 Encounter for supervision of other normal pregnancy, first trimester: Secondary | ICD-10-CM | POA: Insufficient documentation

## 2023-06-20 NOTE — Progress Notes (Signed)
PRENATAL VISIT NOTE  Subjective:  Grace Harper is a 27 y.o. G9F6213 at [redacted]w[redacted]d being seen today for her first prenatal visit for this pregnancy.  She is currently monitored for the following issues for this high-risk pregnancy and has Teenage mother; Contraception management; Weight gain; Generalized headaches; Prediabetes; Hyperlipidemia; IUD check up; and Supervision of high risk pregnancy, antepartum on their problem list.  Patient reports no complaints.  Contractions: Not present. Vag. Bleeding: None.  Movement: Present. Denies leaking of fluid.   She is planning to breastfeed. Unsure for contraception.   The following portions of the patient's history were reviewed and updated as appropriate: allergies, current medications, past family history, past medical history, past social history, past surgical history and problem list.   Objective:   Vitals:   06/20/23 1048  BP: 105/61  Pulse: 77  Weight: 182 lb 6.4 oz (82.7 kg)    Fetal Status: Fetal Heart Rate (bpm): 152   Movement: Present     General:  Alert, oriented and cooperative. Patient is in no acute distress.  Skin: Skin is warm and dry. No rash noted.   Cardiovascular: Normal heart rate and rhythm noted  Respiratory: Normal respiratory effort, no problems with respiration noted. Clear to auscultation.   Abdomen: Soft, gravid, appropriate for gestational age. Normal bowel sounds. Non-tender. Pain/Pressure: Absent     Pelvic: normal        Normal cervical contour, no lesions, no bleeding following pap, normal discharge  Extremities: Normal range of motion.  Edema: None  Mental Status: Normal mood and affect. Normal behavior. Normal judgment and thought content.    Indications for ASA therapy (per uptodate) One of the following: Previous pregnancy with preeclampsia, especially early onset and with an adverse outcome Yes Multifetal gestation No Chronic hypertension No Type 1 or 2 diabetes mellitus No Chronic kidney  disease No Autoimmune disease (antiphospholipid syndrome, systemic lupus erythematosus) No  Two or more of the following: Nulliparity No Obesity (body mass index >30 kg/m2) Yes Family history of preeclampsia in mother or sister No Age >=35 years No Sociodemographic characteristics (African American race, low socioeconomic level) Yes Personal risk factors (eg, previous pregnancy with low birth weight or small for gestational age infant, previous adverse pregnancy outcome [eg, stillbirth], interval >10 years between pregnancies) Yes  Indications for early GDM screening  First-degree relative with diabetes No BMI >30kg/m2 Yes Age > 35 No Previous birth of an infant weighing >=4000 g No Gestational diabetes mellitus in a previous pregnancy No Glycated hemoglobin >=5.7 percent (39 mmol/mol), impaired glucose tolerance, or impaired fasting glucose on previous testing No High-risk race/ethnicity (eg, African American, Latino, Native American, Asian American, Pacific Islander) Yes Previous stillbirth of unknown cause No Maternal birthweight > 9 lbs No History of cardiovascular disease No Hypertension or on therapy for hypertension No High-density lipoprotein cholesterol level <35 mg/dL (0.86 mmol/L) and/or a triglyceride level >250 mg/dL (5.78 mmol/L) No Polycystic ovary syndrome No Physical inactivity No Other clinical condition associated with insulin resistance (eg, severe obesity, acanthosis nigricans) No Current use of glucocorticoids No   Early screening tests: FBS, A1C, Random CBG, glucose challenge  Assessment and Plan:  Pregnancy: [id number] at [redacted]w[redacted]d  Grace Harper was seen today for initial prenatal visit.  [redacted] weeks gestation of pregnancy -     Cytology - PAP -     [place] MFM OB COMP + 14 WK; Future  Supervision of other normal pregnancy, antepartum -     Cytology - PAP -  [place] MFM OB COMP + 14 WK; Future  Encounter for supervision of other normal pregnancy, first trimester -      HORIZON [employer]  Hx of PreE in prior pregnancy      -      Will start ASA 162mg  every day today       -     BP today normotensive, asymptomatic    Preterm labor/first trimester warning symptoms and general obstetric precautions including but not limited to vaginal bleeding, contractions, leaking of fluid and fetal movement were reviewed in detail with the patient. Please refer to After Visit Summary for other counseling recommendations.   - Initial labs reviewed - Continue prenatal vitamins. - Problem list reviewed and updated. - Genetic Screening discussed, First trimester screen, Quad screen, and NIPS:  drawn today, AFP will be collected after 15wks . - Ultrasound discussed; fetal anatomic survey: ordered. - Anticipatory guidance about prenatal visits given including labs, ultrasounds, and testing. - Discussed usage of Babyscripts and virtual visits as additional source of managing and completing prenatal visits in midst of coronavirus and pandemic.   - Encouraged to complete MyChart Registration for her ability to review results, send requests, and have questions addressed.  - The nature of [place] [facility] Center for [facility]/Faculty Practice with multiple MDs and Advanced Practice Providers was explained to patient; also emphasized that residents, students are part of our team. (Prefers female providers) - Routine obstetric precautions reviewed. Encouraged to seek out care at office or emergency room [facility] preferred) for urgent and/or emergent concerns.  Return in about 4 weeks (around 07/18/2023) for ROB 16 weeks .  Future Appointments  Date Time Provider Department Center  07/18/2023 11:15 AM Celedonio Savage, MD [facility] None  08/24/2023 10:15 AM [facility] NURSE [facility]  08/24/2023 10:30 AM [facility] US3 [facility]    Hessie Dibble, MD FMOB Fellow, Faculty practice [facility], Center for [facility] 06/20/23  11:44 AM

## 2023-06-21 LAB — HORIZON CUSTOM: REPORT SUMMARY: POSITIVE — AB

## 2023-06-21 LAB — CYTOLOGY - PAP: Diagnosis: NEGATIVE

## 2023-06-22 ENCOUNTER — Encounter: Payer: Self-pay | Admitting: Obstetrics and Gynecology

## 2023-06-22 DIAGNOSIS — D563 Thalassemia minor: Secondary | ICD-10-CM | POA: Insufficient documentation

## 2023-07-18 ENCOUNTER — Ambulatory Visit (INDEPENDENT_AMBULATORY_CARE_PROVIDER_SITE_OTHER): Payer: BC Managed Care – PPO | Admitting: Family Medicine

## 2023-07-18 VITALS — BP 122/73 | HR 83 | Wt 191.3 lb

## 2023-07-18 DIAGNOSIS — Z3480 Encounter for supervision of other normal pregnancy, unspecified trimester: Secondary | ICD-10-CM | POA: Diagnosis not present

## 2023-07-18 DIAGNOSIS — Z3A16 16 weeks gestation of pregnancy: Secondary | ICD-10-CM | POA: Diagnosis not present

## 2023-07-18 DIAGNOSIS — D563 Thalassemia minor: Secondary | ICD-10-CM

## 2023-07-18 DIAGNOSIS — Z98891 History of uterine scar from previous surgery: Secondary | ICD-10-CM | POA: Diagnosis not present

## 2023-07-18 DIAGNOSIS — O09299 Supervision of pregnancy with other poor reproductive or obstetric history, unspecified trimester: Secondary | ICD-10-CM | POA: Diagnosis not present

## 2023-07-18 NOTE — Progress Notes (Signed)
   PRENATAL VISIT NOTE  Subjective:  Grace Harper is a 27 y.o. G4W1027 at [redacted]w[redacted]d being seen today for ongoing prenatal care.  She is currently monitored for the following issues for this low-risk pregnancy and has Teenage mother; Contraception management; Weight gain; Generalized headaches; Prediabetes; Hyperlipidemia; IUD check up; Supervision of high risk pregnancy, antepartum; H/O pre-eclampsia in prior pregnancy, currently pregnant; Hx of cesarean section; Encounter for supervision of other normal pregnancy, first trimester; and Alpha thalassemia silent carrier on their problem list.  Patient reports no bleeding, no contractions, no cramping, and no leaking.  Contractions: Not present. Vag. Bleeding: None.  Movement: Absent. Denies leaking of fluid.   The following portions of the patient's history were reviewed and updated as appropriate: allergies, current medications, past family history, past medical history, past social history, past surgical history and problem list.   Objective:   Vitals:   07/18/23 1133  BP: 122/73  Pulse: 83  Weight: 191 lb 4.8 oz (86.8 kg)    Fetal Status: Fetal Heart Rate (bpm): 144   Movement: Absent     General:  Alert, oriented and cooperative. Patient is in no acute distress.  Skin: Skin is warm and dry. No rash noted.   Cardiovascular: Normal heart rate noted  Respiratory: Normal respiratory effort, no problems with respiration noted  Abdomen: Soft, gravid, appropriate for gestational age.  Pain/Pressure: Absent     Pelvic: Cervical exam deferred        Extremities: Normal range of motion.  Edema: None  Mental Status: Normal mood and affect. Normal behavior. Normal judgment and thought content.   Assessment and Plan:  Pregnancy: G4P1021 at [redacted]w[redacted]d 1. Supervision of other normal pregnancy, antepartum Continue routine prenatal care FHR and BP appropriate today - AFP, Serum, Open Spina Bifida  2. Hx of cesarean section Would like to [place] -  AFP, Serum, Open Spina Bifida  3. H/O pre-eclampsia in prior pregnancy, currently pregnant BP appropriate today, taking ASA daily - AFP, Serum, Open Spina Bifida  4. [redacted] weeks gestation of pregnancy - AFP, Serum, Open Spina Bifida  5. Alpha thalassemia silent carrier Partner testing kit given today  Preterm labor symptoms and general obstetric precautions including but not limited to vaginal bleeding, contractions, leaking of fluid and fetal movement were reviewed in detail with the patient. Please refer to After Visit Summary for other counseling recommendations.   No follow-ups on file.  Future Appointments  Date Time Provider Department Center  08/17/2023 11:15 AM Warden Fillers, MD [facility] None  08/24/2023 10:15 AM [facility] NURSE [facility]  08/24/2023 10:30 AM [facility] US3 [facility]  09/14/2023 10:55 AM Gerrit Heck, CNM [facility] None    Celedonio Savage, MD

## 2023-07-21 LAB — AFP, SERUM, OPEN SPINA BIFIDA
AFP MoM: 0.94
AFP Value: 29.4 ng/mL
Gest. Age on Collection Date: 16 wk
Maternal Age At EDD: 28.2 a
OSBR Risk 1 IN: 10000
Test Results:: NEGATIVE
Weight: 191 [lb_av]

## 2023-08-17 ENCOUNTER — Ambulatory Visit (INDEPENDENT_AMBULATORY_CARE_PROVIDER_SITE_OTHER): Payer: BC Managed Care – PPO | Admitting: Obstetrics and Gynecology

## 2023-08-17 VITALS — BP 106/70 | HR 94 | Wt 198.0 lb

## 2023-08-17 DIAGNOSIS — Z98891 History of uterine scar from previous surgery: Secondary | ICD-10-CM

## 2023-08-17 DIAGNOSIS — O09299 Supervision of pregnancy with other poor reproductive or obstetric history, unspecified trimester: Secondary | ICD-10-CM

## 2023-08-17 DIAGNOSIS — O0990 Supervision of high risk pregnancy, unspecified, unspecified trimester: Secondary | ICD-10-CM

## 2023-08-17 DIAGNOSIS — Z3A21 21 weeks gestation of pregnancy: Secondary | ICD-10-CM

## 2023-08-17 DIAGNOSIS — D563 Thalassemia minor: Secondary | ICD-10-CM

## 2023-08-17 NOTE — Progress Notes (Signed)
   PRENATAL VISIT NOTE  Subjective:  Grace Harper is a 27 y.o. H4V4259 at [redacted]w[redacted]d being seen today for ongoing prenatal care.  She is currently monitored for the following issues for this low-risk pregnancy and has Teenage mother; Contraception management; Weight gain; Generalized headaches; Prediabetes; Hyperlipidemia; IUD check up; Supervision of high risk pregnancy, antepartum; H/O pre-eclampsia in prior pregnancy, currently pregnant; Hx of cesarean section; Encounter for supervision of other normal pregnancy, first trimester; and Alpha thalassemia silent carrier on their problem list.  Patient doing well with no acute concerns today. She reports no complaints.  Contractions: Not present. Vag. Bleeding: None.  Movement: Present. Denies leaking of fluid.   The following portions of the patient's history were reviewed and updated as appropriate: allergies, current medications, past family history, past medical history, past social history, past surgical history and problem list. Problem list updated.  Objective:   Vitals:   08/17/23 1129  BP: 106/70  Pulse: 94  Weight: 198 lb (89.8 kg)    Fetal Status: Fetal Heart Rate (bpm): 140 Fundal Height: 21 cm Movement: Present     General:  Alert, oriented and cooperative. Patient is in no acute distress.  Skin: Skin is warm and dry. No rash noted.   Cardiovascular: Normal heart rate noted  Respiratory: Normal respiratory effort, no problems with respiration noted  Abdomen: Soft, gravid, appropriate for gestational age.  Pain/Pressure: Present     Pelvic: Cervical exam deferred        Extremities: Normal range of motion.     Mental Status:  Normal mood and affect. Normal behavior. Normal judgment and thought content.   Assessment and Plan:  Pregnancy: G4P1021 at [redacted]w[redacted]d  1. [redacted] weeks gestation of pregnancy (Primary)   2. Alpha thalassemia silent carrier   3. H/O pre-eclampsia in prior pregnancy, currently pregnant No s/sx of  preeclampsia, pt compliant with baby ASA  4. Hx of cesarean section Discuss route of delivery at 28 week visit  5. Supervision of high risk pregnancy, antepartum Continue routine prenatal care  Preterm labor symptoms and general obstetric precautions including but not limited to vaginal bleeding, contractions, leaking of fluid and fetal movement were reviewed in detail with the patient.  Please refer to After Visit Summary for other counseling recommendations.   Return in about 4 weeks (around 09/14/2023) for ROB, in person.   Mariel Aloe, MD Faculty Attending Center for [facility]

## 2023-08-21 DIAGNOSIS — O99210 Obesity complicating pregnancy, unspecified trimester: Secondary | ICD-10-CM | POA: Insufficient documentation

## 2023-08-24 ENCOUNTER — Other Ambulatory Visit: Payer: Self-pay | Admitting: *Deleted

## 2023-08-24 ENCOUNTER — Ambulatory Visit: Payer: BC Managed Care – PPO

## 2023-08-24 ENCOUNTER — Ambulatory Visit: Payer: BC Managed Care – PPO | Attending: Obstetrics and Gynecology

## 2023-08-24 VITALS — BP 115/54 | HR 84

## 2023-08-24 DIAGNOSIS — E669 Obesity, unspecified: Secondary | ICD-10-CM | POA: Diagnosis not present

## 2023-08-24 DIAGNOSIS — O99212 Obesity complicating pregnancy, second trimester: Secondary | ICD-10-CM | POA: Diagnosis not present

## 2023-08-24 DIAGNOSIS — D563 Thalassemia minor: Secondary | ICD-10-CM

## 2023-08-24 DIAGNOSIS — O09299 Supervision of pregnancy with other poor reproductive or obstetric history, unspecified trimester: Secondary | ICD-10-CM

## 2023-08-24 DIAGNOSIS — O0990 Supervision of high risk pregnancy, unspecified, unspecified trimester: Secondary | ICD-10-CM

## 2023-08-24 DIAGNOSIS — O99210 Obesity complicating pregnancy, unspecified trimester: Secondary | ICD-10-CM

## 2023-08-24 DIAGNOSIS — O34219 Maternal care for unspecified type scar from previous cesarean delivery: Secondary | ICD-10-CM

## 2023-08-24 DIAGNOSIS — O99012 Anemia complicating pregnancy, second trimester: Secondary | ICD-10-CM

## 2023-08-24 DIAGNOSIS — Z3A21 21 weeks gestation of pregnancy: Secondary | ICD-10-CM

## 2023-08-24 DIAGNOSIS — O09292 Supervision of pregnancy with other poor reproductive or obstetric history, second trimester: Secondary | ICD-10-CM

## 2023-09-14 ENCOUNTER — Ambulatory Visit: Payer: BC Managed Care – PPO

## 2023-09-14 ENCOUNTER — Telehealth: Payer: Self-pay

## 2023-09-14 VITALS — BP 120/68 | HR 82 | Wt 209.7 lb

## 2023-09-14 DIAGNOSIS — O0990 Supervision of high risk pregnancy, unspecified, unspecified trimester: Secondary | ICD-10-CM

## 2023-09-14 DIAGNOSIS — Z3A24 24 weeks gestation of pregnancy: Secondary | ICD-10-CM

## 2023-09-14 DIAGNOSIS — O99210 Obesity complicating pregnancy, unspecified trimester: Secondary | ICD-10-CM

## 2023-09-14 NOTE — Telephone Encounter (Signed)
Returned call, pt stated that she had an appt in office today and that her employer is refusing to accept the letter with work restrictions and will only accept paperwork. Pt states that her employees requests paperwork to be filled out. Advised pt to contact HR dept and notify them to fax [place] paperwork and that she has a doctor's letter with restrictions that need to be followed until paper is completed, pt voiced understanding.

## 2023-09-14 NOTE — Progress Notes (Signed)
No c/o today 

## 2023-09-14 NOTE — Progress Notes (Signed)
HIGH-RISK PREGNANCY OFFICE VISIT  Patient name: Grace Harper MRN [id number]  Date of birth: 12/21/1995 Chief Complaint:   Routine Prenatal Visit  Subjective:   Grace Harper is a 28 y.o. G60P1021 female at [redacted]w[redacted]d with an Estimated Date of Delivery: 01/01/24 being seen today for ongoing management of a high-risk pregnancy aeb has Generalized headaches; Prediabetes; Supervision of high risk pregnancy, antepartum; H/O pre-eclampsia in prior pregnancy, currently pregnant; Hx of cesarean section; Alpha thalassemia silent carrier; and Obesity affecting pregnancy on their problem list.  Patient presents today, alone, with  concerns for toothache and tylenol dosing as well as lifting at work .  She states last week she had a toothache and took tylenol with relief.  She states she needs to see an dentist, but until that time how much tylenol is safe. Patient also states that she is still required to lift residents, at work, and finds that it is causing back pain.   Patient endorses fetal movement. Patient denies abdominal cramping or contractions.  Patient denies vaginal concerns including abnormal discharge, leaking of fluid, and bleeding. No issues with urination, constipation, or diarrhea.    Contractions: Not present. Vag. Bleeding: None.  Movement: Present.  Reviewed past medical,surgical, social, obstetrical and family history as well as problem list, medications and allergies.  Objective   Vitals:   09/14/23 1124  BP: 120/68  Pulse: 82  Weight: 209 lb 11.2 oz (95.1 kg)  Body mass index is 40.95 kg/m.  Total Weight Gain:29 lb 11.2 oz (13.5 kg)         Physical Examination:   General appearance: Well appearing, and in no distress  Mental status: Alert, oriented to person, place, and time  Skin: Warm & dry  Cardiovascular: Normal heart rate noted  Respiratory: Normal respiratory effort, no distress  Abdomen: Soft, gravid, nontender, AGA with Fundal Height: 23 cm  Pelvic:  Cervical exam deferred           Extremities: Edema: None  Fetal Status: Fetal Heart Rate (bpm): 145  Movement: Present   No results found for this or any previous visit (from the past 24 hours).  Assessment & Plan:  High-risk pregnancy of a 28 y.o., [id number] at [redacted]w[redacted]d with an Estimated Date of Delivery: 01/01/24   1. Supervision of high risk pregnancy, antepartum -Anticipatory guidance for upcoming appts. -Patient to schedule next appt in 3-4 weeks for an in-person visit. -Reviewed usage of tylenol and instructed not to exceed 4000 mg in 24 hours. -Encouraged to find dentist to treat tooth related issues. Note given.  2. Obesity affecting pregnancy, antepartum, unspecified obesity type -Taking bASA. -TWG 29lbs -Discussed usage of maternity belt/back support for work.  -Given work note with lifting restriction of </=20lbs  3. [redacted] weeks gestation of pregnancy -Reviewed complaints. -Reviewed Glucola appt preparation including fasting the night before and morning of.   *Informed that it is okay to drink plain water throughout the night and prior to consumption of Glucola formula.  -Discussed anticipated office time of 2.5-3 hours.  -Reviewed blood draw procedures and labs which also include check of iron/HgB level, RPR, and HIV *Informed that repeat RPR/HIV are for pediatric records/compliance.  -Discussed how results of GTT are handled including diabetic education and BS testing for abnormal results and routine care for normal results.     Meds: No orders of the defined types were placed in this encounter.  Labs/procedures today:  Lab Orders  No laboratory test(s) ordered today  Reviewed: Preterm labor symptoms and general obstetric precautions including but not limited to vaginal bleeding, contractions, leaking of fluid and fetal movement were reviewed in detail with the patient.  All questions were answered.  Follow-up: No follow-ups on file.  No orders of the defined types  were placed in this encounter.  Cherre Robins MSN, CNM 09/14/2023

## 2023-09-26 ENCOUNTER — Ambulatory Visit: Payer: BC Managed Care – PPO

## 2023-09-28 ENCOUNTER — Other Ambulatory Visit: Payer: Self-pay | Admitting: *Deleted

## 2023-09-28 ENCOUNTER — Ambulatory Visit: Payer: BC Managed Care – PPO | Attending: Obstetrics and Gynecology | Admitting: Obstetrics and Gynecology

## 2023-09-28 ENCOUNTER — Other Ambulatory Visit: Payer: Self-pay

## 2023-09-28 ENCOUNTER — Ambulatory Visit: Payer: BC Managed Care – PPO | Admitting: *Deleted

## 2023-09-28 ENCOUNTER — Ambulatory Visit: Payer: BC Managed Care – PPO | Attending: Obstetrics and Gynecology

## 2023-09-28 VITALS — BP 121/53 | HR 79

## 2023-09-28 DIAGNOSIS — O0990 Supervision of high risk pregnancy, unspecified, unspecified trimester: Secondary | ICD-10-CM

## 2023-09-28 DIAGNOSIS — O99212 Obesity complicating pregnancy, second trimester: Secondary | ICD-10-CM | POA: Diagnosis not present

## 2023-09-28 DIAGNOSIS — E669 Obesity, unspecified: Secondary | ICD-10-CM | POA: Diagnosis not present

## 2023-09-28 DIAGNOSIS — O365920 Maternal care for other known or suspected poor fetal growth, second trimester, not applicable or unspecified: Secondary | ICD-10-CM

## 2023-09-28 DIAGNOSIS — Z3A26 26 weeks gestation of pregnancy: Secondary | ICD-10-CM

## 2023-09-28 DIAGNOSIS — O34219 Maternal care for unspecified type scar from previous cesarean delivery: Secondary | ICD-10-CM | POA: Insufficient documentation

## 2023-09-28 DIAGNOSIS — D563 Thalassemia minor: Secondary | ICD-10-CM | POA: Diagnosis not present

## 2023-09-28 DIAGNOSIS — O09299 Supervision of pregnancy with other poor reproductive or obstetric history, unspecified trimester: Secondary | ICD-10-CM | POA: Diagnosis not present

## 2023-09-28 DIAGNOSIS — O99013 Anemia complicating pregnancy, third trimester: Secondary | ICD-10-CM

## 2023-09-28 NOTE — Progress Notes (Signed)
 Maternal-Fetal Medicine  I had the pleasure of seeing Ms. Grace [facility] today at [facility]. She is G4 P1021 at 26w 3d gestation and is here for completion of fetal anatomy. Her pregnancy is well dated by 11-week ultrasound.  On cell-free fetal DNA screening, the risks of fetal aneuploidies are not increased.  MSAFP was within normal range.  Obstetric history is significant for a term cesarean delivery of a female infant weighing 6 pounds and 15 ounces at birth.  Her pregnancy was complicated by preeclampsia.  Patient takes low-dose aspirin  prophylaxis.  Ultrasound The estimated fetal weight and the abdominal circumference measurements are at the sixth percentiles.  Amniotic fluid is normal good fetal activity seen.  Umbilical artery Doppler showed increased S/D ratio.  Fetal anatomical survey was completed and appears normal.  Fetal growth restriction I explained the finding of fetal growth restriction that is difficult to differentiate from a constitutionally small for gestational age fetus. Abnormal UA Doppler studies raise the possibility of fetal growth restriction.  I discussed the possible causes of fetal growth restriction including placental insufficiency (most common cause), chromosomal anomalies and fetal infections.  Patient did not give history of fever or rashes.  I explained that only amniocentesis will give a definitive result on the fetal karyotype and some genetic conditions (Microarray).  I explained amniocentesis procedure and possible complication of preterm delivery (1 and 500 procedures).  I discussed ultrasound protocol of monitoring fetal growth restriction.  Patient's previous pregnancy was complicated by preeclampsia. Fetal growth restriction may precede clinical manifestation of preeclampsia in this pregnancy.  Timing of delivery: Since small for gestational age fetuses have a higher chance of having perinatal mortality and morbidity, delivery at  51 to [redacted] weeks gestation is reasonable.  If, however, severe fetal growth restriction is seen with normal antenatal testing, we will recommend delivery at [redacted] weeks gestation.  Recommendations -An appointment was made for her to return in 2 weeks for UA Doppler studies and NST. -Fetal growth assessment, UA Doppler and NST in 3 weeks.  Thank you for consultation.  If you have any questions or concerns, please contact me the [facility].  Consultation including face-to-face (more than 50%) counseling 30 minutes.

## 2023-10-02 ENCOUNTER — Ambulatory Visit: Payer: BC Managed Care – PPO

## 2023-10-04 ENCOUNTER — Telehealth: Payer: Self-pay

## 2023-10-04 NOTE — Telephone Encounter (Signed)
Returned call and answered questions about meds and paperwork.

## 2023-10-11 ENCOUNTER — Ambulatory Visit: Payer: BC Managed Care – PPO | Admitting: *Deleted

## 2023-10-11 ENCOUNTER — Ambulatory Visit: Payer: BC Managed Care – PPO | Attending: Obstetrics and Gynecology

## 2023-10-11 ENCOUNTER — Ambulatory Visit (HOSPITAL_BASED_OUTPATIENT_CLINIC_OR_DEPARTMENT_OTHER): Payer: BC Managed Care – PPO | Admitting: *Deleted

## 2023-10-11 VITALS — BP 119/53 | HR 87

## 2023-10-11 DIAGNOSIS — D563 Thalassemia minor: Secondary | ICD-10-CM | POA: Insufficient documentation

## 2023-10-11 DIAGNOSIS — O365930 Maternal care for other known or suspected poor fetal growth, third trimester, not applicable or unspecified: Secondary | ICD-10-CM

## 2023-10-11 DIAGNOSIS — O0990 Supervision of high risk pregnancy, unspecified, unspecified trimester: Secondary | ICD-10-CM | POA: Insufficient documentation

## 2023-10-11 DIAGNOSIS — E669 Obesity, unspecified: Secondary | ICD-10-CM | POA: Diagnosis not present

## 2023-10-11 DIAGNOSIS — O365920 Maternal care for other known or suspected poor fetal growth, second trimester, not applicable or unspecified: Secondary | ICD-10-CM | POA: Diagnosis present

## 2023-10-11 DIAGNOSIS — O99012 Anemia complicating pregnancy, second trimester: Secondary | ICD-10-CM

## 2023-10-11 DIAGNOSIS — Z3A28 28 weeks gestation of pregnancy: Secondary | ICD-10-CM

## 2023-10-11 DIAGNOSIS — O09293 Supervision of pregnancy with other poor reproductive or obstetric history, third trimester: Secondary | ICD-10-CM

## 2023-10-11 DIAGNOSIS — O99213 Obesity complicating pregnancy, third trimester: Secondary | ICD-10-CM | POA: Diagnosis not present

## 2023-10-11 DIAGNOSIS — O34219 Maternal care for unspecified type scar from previous cesarean delivery: Secondary | ICD-10-CM

## 2023-10-11 NOTE — Procedures (Signed)
[name] 1996-01-14 [redacted]w[redacted]d  Fetus A Non-Stress Test Interpretation for 10/11/23  Indication: IUGR and Obesity  Fetal Heart Rate A Mode: External Baseline Rate (A): 135 bpm Variability: Moderate Accelerations: 15 x 15 Decelerations: None Multiple birth?: No  Uterine Activity Mode: Palpation, Toco Contraction Frequency (min): None Resting Tone Palpated: Relaxed Resting Time: Adequate  Interpretation (Fetal Testing) Nonstress Test Interpretation: Reactive Comments: Dr. Parke Poisson reviewed tracing.

## 2023-10-12 ENCOUNTER — Encounter: Payer: BC Managed Care – PPO | Admitting: Obstetrics and Gynecology

## 2023-10-12 ENCOUNTER — Other Ambulatory Visit: Payer: BC Managed Care – PPO

## 2023-10-14 DIAGNOSIS — O365990 Maternal care for other known or suspected poor fetal growth, unspecified trimester, not applicable or unspecified: Secondary | ICD-10-CM | POA: Insufficient documentation

## 2023-10-17 ENCOUNTER — Ambulatory Visit: Payer: BC Managed Care – PPO

## 2023-10-17 ENCOUNTER — Encounter: Payer: Self-pay | Admitting: Obstetrics & Gynecology

## 2023-10-17 ENCOUNTER — Ambulatory Visit: Payer: BC Managed Care – PPO | Admitting: *Deleted

## 2023-10-17 ENCOUNTER — Ambulatory Visit (HOSPITAL_BASED_OUTPATIENT_CLINIC_OR_DEPARTMENT_OTHER): Payer: BC Managed Care – PPO | Admitting: Obstetrics and Gynecology

## 2023-10-17 ENCOUNTER — Other Ambulatory Visit: Payer: Self-pay

## 2023-10-17 ENCOUNTER — Ambulatory Visit: Payer: BC Managed Care – PPO | Attending: Obstetrics and Gynecology

## 2023-10-17 ENCOUNTER — Other Ambulatory Visit: Payer: BC Managed Care – PPO

## 2023-10-17 ENCOUNTER — Ambulatory Visit (INDEPENDENT_AMBULATORY_CARE_PROVIDER_SITE_OTHER): Payer: BC Managed Care – PPO | Admitting: Obstetrics & Gynecology

## 2023-10-17 VITALS — BP 130/76 | HR 81 | Wt 218.6 lb

## 2023-10-17 DIAGNOSIS — E669 Obesity, unspecified: Secondary | ICD-10-CM

## 2023-10-17 DIAGNOSIS — O365930 Maternal care for other known or suspected poor fetal growth, third trimester, not applicable or unspecified: Secondary | ICD-10-CM

## 2023-10-17 DIAGNOSIS — O0990 Supervision of high risk pregnancy, unspecified, unspecified trimester: Secondary | ICD-10-CM

## 2023-10-17 DIAGNOSIS — O09293 Supervision of pregnancy with other poor reproductive or obstetric history, third trimester: Secondary | ICD-10-CM

## 2023-10-17 DIAGNOSIS — D563 Thalassemia minor: Secondary | ICD-10-CM

## 2023-10-17 DIAGNOSIS — O403XX0 Polyhydramnios, third trimester, not applicable or unspecified: Secondary | ICD-10-CM

## 2023-10-17 DIAGNOSIS — Z3A29 29 weeks gestation of pregnancy: Secondary | ICD-10-CM | POA: Diagnosis not present

## 2023-10-17 DIAGNOSIS — E6689 Other obesity not elsewhere classified: Secondary | ICD-10-CM | POA: Diagnosis not present

## 2023-10-17 DIAGNOSIS — O2600 Excessive weight gain in pregnancy, unspecified trimester: Secondary | ICD-10-CM | POA: Insufficient documentation

## 2023-10-17 DIAGNOSIS — O99213 Obesity complicating pregnancy, third trimester: Secondary | ICD-10-CM

## 2023-10-17 DIAGNOSIS — O09299 Supervision of pregnancy with other poor reproductive or obstetric history, unspecified trimester: Secondary | ICD-10-CM

## 2023-10-17 DIAGNOSIS — O99210 Obesity complicating pregnancy, unspecified trimester: Secondary | ICD-10-CM

## 2023-10-17 DIAGNOSIS — O34219 Maternal care for unspecified type scar from previous cesarean delivery: Secondary | ICD-10-CM

## 2023-10-17 DIAGNOSIS — O365990 Maternal care for other known or suspected poor fetal growth, unspecified trimester, not applicable or unspecified: Secondary | ICD-10-CM

## 2023-10-17 DIAGNOSIS — O365920 Maternal care for other known or suspected poor fetal growth, second trimester, not applicable or unspecified: Secondary | ICD-10-CM | POA: Diagnosis not present

## 2023-10-17 DIAGNOSIS — Z98891 History of uterine scar from previous surgery: Secondary | ICD-10-CM

## 2023-10-17 DIAGNOSIS — O99013 Anemia complicating pregnancy, third trimester: Secondary | ICD-10-CM

## 2023-10-17 NOTE — Progress Notes (Signed)
 Pt presents for ROB. Gtt today. Wants tdap at next visit. Complaints of eczema spots that burn.

## 2023-10-17 NOTE — Progress Notes (Signed)
 Maternal-Fetal Medicine Consultation I had the pleasure of seeing Grace Harper today at the [facility]. She is here for fetal growth assessment. On ultrasound performed almost 3 weeks ago, the estimated fetal weight and the abdominal circumference measurements were at the 6 percentiles.  Umbilical artery Doppler studies have been abnormal with increased S/D ratio on previous 2 visits. Blood pressure today at our office is 130/76 mmHg.  Patient had a blood drawn today for gestational diabetes screening.  Ultrasound On today's ultrasound, the estimated fetal weight is at the 12 percentile and the abdominal circumference measurement at the 18th percentile.  Mild polyhydramnios is seen (AFI 26 cm).  Umbilical artery Doppler showed normal forward diastolic flow.  NST is reactive.  Polyhydramnios I reassured the patient of normal fetal growth assessment, and resolution of fetal growth restriction.  I reassured her of normal Doppler studies. I discussed the finding of mild polyhydramnios that can be associated with gestational diabetes.  In the absence of gestational diabetes, it is usually idiopathic (no known cause) and is associated with good outcomes.  Recommendations -UA Doppler and NST next week. -UA Doppler, BPP and fetal growth assessment in 3 weeks.  Consultation including face-to-face (more than 50%) counseling 20 minutes.

## 2023-10-17 NOTE — Progress Notes (Signed)
   PRENATAL VISIT NOTE  Subjective:  Grace Harper is a 28 y.o. Z6X0960 at [redacted]w[redacted]d being seen today for ongoing prenatal care.  She is currently monitored for the following issues for this high-risk pregnancy and has Generalized headaches; Prediabetes; Supervision of high risk pregnancy, antepartum; H/O pre-eclampsia in prior pregnancy, currently pregnant; Hx of cesarean section; Alpha thalassemia silent carrier; Obesity affecting pregnancy; Intrauterine growth restriction (IUGR) affecting care of mother; and Excessive weight gain affecting pregnancy on their problem list.  Patient reports no complaints.  Contractions: Not present. Vag. Bleeding: None.  Movement: Present. Denies leaking of fluid.   The following portions of the patient's history were reviewed and updated as appropriate: allergies, current medications, past family history, past medical history, past social history, past surgical history and problem list.   Objective:   Vitals:   10/17/23 0829  BP: 130/76  Pulse: 81  Weight: 218 lb 9.6 oz (99.2 kg)    Fetal Status:     Movement: Present     General:  Alert, oriented and cooperative. Patient is in no acute distress.  Skin: Skin is warm and dry. No rash noted.   Cardiovascular: Normal heart rate noted  Respiratory: Normal respiratory effort, no problems with respiration noted  Abdomen: Soft, gravid, appropriate for gestational age.  Pain/Pressure: Absent     Pelvic: Cervical exam deferred        Extremities: Normal range of motion.  Edema: None  Mental Status: Normal mood and affect. Normal behavior. Normal judgment and thought content.   Assessment and Plan:  Pregnancy: G4P1021 at [redacted]w[redacted]d 1. Supervision of high risk pregnancy, antepartum (Primary)  - Glucose Tolerance, 2 Hours w/1 Hour - RPR - CBC - HIV antibody (with reflex)  2. [redacted] weeks gestation of pregnancy  - Glucose Tolerance, 2 Hours w/1 Hour - RPR - CBC - HIV antibody (with reflex)  3. Excessive  weight gain affecting pregnancy  - TSH + free T4  4. H/O pre-eclampsia in prior pregnancy, currently pregnant  - TSH + free T4 - Protein / creatinine ratio, urine  5. Hx of cesarean section (failure to progress) - discussed RCS versus TOLAC  6. Alpha thalassemia silent carrier - aware that partner can be tested  7. Obesity affecting pregnancy, antepartum, unspecified obesity type  - TSH + free T4  8. Poor fetal growth affecting management of mother, antepartum, single or unspecified fetus - follow up MFM scan today  Preterm labor symptoms and general obstetric precautions including but not limited to vaginal bleeding, contractions, leaking of fluid and fetal movement were reviewed in detail with the patient. Please refer to After Visit Summary for other counseling recommendations.   Return in about 2 weeks (around 10/31/2023).  Future Appointments  Date Time Provider Department Center  10/17/2023  1:15 PM [facility] NURSE [facility]  10/17/2023  1:30 PM [facility] US4 [facility]  10/17/2023  2:15 PM [facility] NST [facility]  10/25/2023  1:15 PM [facility]URSE [facility]  10/25/2023  1:30 PM [facility] US2 [facility]  10/25/2023  2:15 PM [facility] NST [facility]    Teegan Brandis Inda Coke, MD

## 2023-10-17 NOTE — Procedures (Signed)
 Grace Harper 24-May-1996 107w1d  Fetus A Non-Stress Test Interpretation for 10/17/23- NST  Indication: IUGR, Polyhydramnios, and al thal  Fetal Heart Rate A Mode: External Baseline Rate (A): 145 bpm Variability: Moderate Accelerations: 10 x 10 Decelerations: None Multiple birth?: No  Uterine Activity Mode: Toco Contraction Frequency (min): none Resting Tone Palpated: Relaxed  Interpretation (Fetal Testing) Nonstress Test Interpretation: Reactive Comments: Tracing reviewed by Dr. Judeth Cornfield

## 2023-10-18 LAB — GLUCOSE TOLERANCE, 2 HOURS W/ 1HR
Glucose, 1 hour: 112 mg/dL (ref 70–179)
Glucose, 2 hour: 98 mg/dL (ref 70–152)
Glucose, Fasting: 87 mg/dL (ref 70–91)

## 2023-10-18 LAB — HIV ANTIBODY (ROUTINE TESTING W REFLEX): HIV Screen 4th Generation wRfx: NONREACTIVE

## 2023-10-18 LAB — CBC
Hematocrit: 41.3 % (ref 34.0–46.6)
Hemoglobin: 12.9 g/dL (ref 11.1–15.9)
MCH: 26.8 pg (ref 26.6–33.0)
MCHC: 31.2 g/dL — ABNORMAL LOW (ref 31.5–35.7)
MCV: 86 fL (ref 79–97)
Platelets: 169 10*3/uL (ref 150–450)
RBC: 4.81 x10E6/uL (ref 3.77–5.28)
RDW: 14.6 % (ref 11.7–15.4)
WBC: 7.7 10*3/uL (ref 3.4–10.8)

## 2023-10-18 LAB — RPR: RPR Ser Ql: NONREACTIVE

## 2023-10-19 LAB — SPECIMEN STATUS REPORT

## 2023-10-19 LAB — TSH+FREE T4
Free T4: 0.87 ng/dL (ref 0.82–1.77)
TSH: 1.53 u[IU]/mL (ref 0.450–4.500)

## 2023-10-25 ENCOUNTER — Ambulatory Visit (HOSPITAL_BASED_OUTPATIENT_CLINIC_OR_DEPARTMENT_OTHER): Payer: BC Managed Care – PPO

## 2023-10-25 ENCOUNTER — Other Ambulatory Visit: Payer: Self-pay

## 2023-10-25 ENCOUNTER — Other Ambulatory Visit: Payer: Self-pay | Admitting: *Deleted

## 2023-10-25 ENCOUNTER — Other Ambulatory Visit: Payer: Self-pay | Admitting: Obstetrics and Gynecology

## 2023-10-25 ENCOUNTER — Ambulatory Visit: Payer: BC Managed Care – PPO | Admitting: *Deleted

## 2023-10-25 ENCOUNTER — Ambulatory Visit: Payer: BC Managed Care – PPO | Attending: Obstetrics and Gynecology

## 2023-10-25 VITALS — BP 116/62 | HR 91

## 2023-10-25 DIAGNOSIS — D563 Thalassemia minor: Secondary | ICD-10-CM | POA: Insufficient documentation

## 2023-10-25 DIAGNOSIS — O0990 Supervision of high risk pregnancy, unspecified, unspecified trimester: Secondary | ICD-10-CM | POA: Diagnosis not present

## 2023-10-25 DIAGNOSIS — O365990 Maternal care for other known or suspected poor fetal growth, unspecified trimester, not applicable or unspecified: Secondary | ICD-10-CM

## 2023-10-25 DIAGNOSIS — O09293 Supervision of pregnancy with other poor reproductive or obstetric history, third trimester: Secondary | ICD-10-CM

## 2023-10-25 DIAGNOSIS — O365930 Maternal care for other known or suspected poor fetal growth, third trimester, not applicable or unspecified: Secondary | ICD-10-CM

## 2023-10-25 DIAGNOSIS — O34219 Maternal care for unspecified type scar from previous cesarean delivery: Secondary | ICD-10-CM

## 2023-10-25 DIAGNOSIS — Z3A30 30 weeks gestation of pregnancy: Secondary | ICD-10-CM | POA: Insufficient documentation

## 2023-10-25 DIAGNOSIS — O403XX0 Polyhydramnios, third trimester, not applicable or unspecified: Secondary | ICD-10-CM | POA: Diagnosis not present

## 2023-10-25 DIAGNOSIS — O365920 Maternal care for other known or suspected poor fetal growth, second trimester, not applicable or unspecified: Secondary | ICD-10-CM

## 2023-10-25 NOTE — Procedures (Signed)
 Grace Harper 18-Apr-1996 [redacted]w[redacted]d  Fetus A Non-Stress Test Interpretation for 10/25/23  Indication: IUGR and Obesity  Fetal Heart Rate A Mode: External Baseline Rate (A): 120 bpm Variability: Moderate Accelerations: 10 x 10 Decelerations: None Multiple birth?: No  Uterine Activity Mode: Palpation, Toco Contraction Frequency (min): None Resting Tone Palpated: Relaxed Resting Time: Adequate  Interpretation (Fetal Testing) Nonstress Test Interpretation: Reactive Comments: Reviewed by Dr. Darra Lis

## 2023-10-30 ENCOUNTER — Other Ambulatory Visit: Payer: Self-pay | Admitting: *Deleted

## 2023-10-30 DIAGNOSIS — O219 Vomiting of pregnancy, unspecified: Secondary | ICD-10-CM

## 2023-10-30 MED ORDER — PROMETHAZINE HCL 25 MG PO TABS: 25.0000 mg | ORAL_TABLET | Freq: Four times a day (QID) | ORAL | 2 refills | Status: DC | PRN

## 2023-10-30 NOTE — Progress Notes (Signed)
 RX phenergan for N/V in pregnancy. Pt also has diarrhea and suspects viral infection. Safe meds in pregnancy list sent. Advised to seek care in [facility] if unable to keep down food or fluids, or for decreased FM or UC's. Pt verbalized understanding.

## 2023-10-31 ENCOUNTER — Ambulatory Visit (INDEPENDENT_AMBULATORY_CARE_PROVIDER_SITE_OTHER): Admitting: Obstetrics & Gynecology

## 2023-10-31 ENCOUNTER — Encounter: Payer: BC Managed Care – PPO | Admitting: Obstetrics & Gynecology

## 2023-10-31 VITALS — BP 114/61 | HR 90 | Wt 218.1 lb

## 2023-10-31 DIAGNOSIS — O0990 Supervision of high risk pregnancy, unspecified, unspecified trimester: Secondary | ICD-10-CM | POA: Diagnosis not present

## 2023-10-31 DIAGNOSIS — Z98891 History of uterine scar from previous surgery: Secondary | ICD-10-CM

## 2023-10-31 DIAGNOSIS — D563 Thalassemia minor: Secondary | ICD-10-CM

## 2023-10-31 DIAGNOSIS — O2600 Excessive weight gain in pregnancy, unspecified trimester: Secondary | ICD-10-CM

## 2023-10-31 DIAGNOSIS — O99210 Obesity complicating pregnancy, unspecified trimester: Secondary | ICD-10-CM

## 2023-10-31 DIAGNOSIS — O09299 Supervision of pregnancy with other poor reproductive or obstetric history, unspecified trimester: Secondary | ICD-10-CM | POA: Diagnosis not present

## 2023-10-31 DIAGNOSIS — Z3A31 31 weeks gestation of pregnancy: Secondary | ICD-10-CM

## 2023-10-31 DIAGNOSIS — O365990 Maternal care for other known or suspected poor fetal growth, unspecified trimester, not applicable or unspecified: Secondary | ICD-10-CM

## 2023-10-31 NOTE — Progress Notes (Signed)
   PRENATAL VISIT NOTE  Subjective:  Grace Harper is a 28 y.o. [id number] at [redacted]w[redacted]d being seen today for ongoing prenatal care.  She is currently monitored for the following issues for this high-risk pregnancy and has Generalized headaches; Prediabetes; Supervision of high risk pregnancy, antepartum; H/O pre-eclampsia in prior pregnancy, currently pregnant; Hx of cesarean section; Alpha thalassemia silent carrier; Obesity affecting pregnancy; Intrauterine growth restriction (IUGR) affecting care of mother; and Excessive weight gain affecting pregnancy on their problem list.  Patient reports no complaints.  Contractions: Not present. Vag. Bleeding: None.  Movement: Present. Denies leaking of fluid.   The following portions of the patient's history were reviewed and updated as appropriate: allergies, current medications, past family history, past medical history, past social history, past surgical history and problem list.   Objective:   Vitals:   10/31/23 1511  BP: 114/61  Pulse: 90  Weight: 218 lb 1.6 oz (98.9 kg)    Fetal Status: Fetal Heart Rate (bpm): 142 Fundal Height: 32 cm Movement: Present     General:  Alert, oriented and cooperative. Patient is in no acute distress.  Skin: Skin is warm and dry. No rash noted.   Cardiovascular: Normal heart rate noted  Respiratory: Normal respiratory effort, no problems with respiration noted  Abdomen: Soft, gravid, appropriate for gestational age.  Pain/Pressure: Present     Pelvic: Cervical exam deferred        Extremities: Normal range of motion.  Edema: None  Mental Status: Normal mood and affect. Normal behavior. Normal judgment and thought content.   Assessment and Plan:  Pregnancy: G4P1021 at [redacted]w[redacted]d 1. Supervision of high risk pregnancy, antepartum (Primary)   2. H/O pre-eclampsia in prior pregnancy, currently pregnant - discussed s/sx  3. Hx of cesarean section - She is considering a TOLAC  4. Alpha thalassemia silent  carrier - aware that FOB can be tested  5. Obesity affecting pregnancy, antepartum, unspecified obesity type - on baby asa - baseline labs already obtained  6. Poor fetal growth affecting management of mother, antepartum, single or unspecified fetus - 12% growth on recent MFM scan - MFM dopplers and scans already scheduled   7. Excessive weight gain affecting pregnancy - she has not gained more weight in 2 weeks  8. [redacted] weeks gestation of pregnancy - she is unsure about breastfeeding and contraception at this time.  9. Mild polyhydramnios noted at recent MFM scan - she had a normal 2 hour GTT   Preterm labor symptoms and general obstetric precautions including but not limited to vaginal bleeding, contractions, leaking of fluid and fetal movement were reviewed in detail with the patient. Please refer to After Visit Summary for other counseling recommendations.   Return in about 2 weeks (around 11/14/2023).  Future Appointments  Date Time Provider Department Center  11/07/2023 10:15 AM [facility] NURSE [facility]  11/07/2023 10:30 AM [facility] US6 [facility]  11/15/2023  2:15 PM [facility] NURSE [facility]  11/15/2023  2:30 PM [facility] US2 [facility]  11/22/2023  1:15 PM [facility] NURSE [facility]  11/22/2023  1:30 PM [facility] US3 [facility]    Allie Bossier, MD

## 2023-10-31 NOTE — Progress Notes (Signed)
 Pt presents for rob. Pt has no questions or concerns at this time.

## 2023-11-02 ENCOUNTER — Other Ambulatory Visit: Payer: Self-pay | Admitting: *Deleted

## 2023-11-02 DIAGNOSIS — O34219 Maternal care for unspecified type scar from previous cesarean delivery: Secondary | ICD-10-CM

## 2023-11-02 DIAGNOSIS — O99213 Obesity complicating pregnancy, third trimester: Secondary | ICD-10-CM

## 2023-11-02 DIAGNOSIS — O365930 Maternal care for other known or suspected poor fetal growth, third trimester, not applicable or unspecified: Secondary | ICD-10-CM

## 2023-11-02 DIAGNOSIS — O409XX0 Polyhydramnios, unspecified trimester, not applicable or unspecified: Secondary | ICD-10-CM

## 2023-11-03 ENCOUNTER — Other Ambulatory Visit: Payer: Self-pay | Admitting: *Deleted

## 2023-11-03 DIAGNOSIS — O365990 Maternal care for other known or suspected poor fetal growth, unspecified trimester, not applicable or unspecified: Secondary | ICD-10-CM

## 2023-11-07 ENCOUNTER — Ambulatory Visit: Payer: BC Managed Care – PPO

## 2023-11-07 ENCOUNTER — Ambulatory Visit: Payer: BC Managed Care – PPO | Attending: Obstetrics and Gynecology

## 2023-11-07 DIAGNOSIS — O99213 Obesity complicating pregnancy, third trimester: Secondary | ICD-10-CM | POA: Diagnosis not present

## 2023-11-07 DIAGNOSIS — O365930 Maternal care for other known or suspected poor fetal growth, third trimester, not applicable or unspecified: Secondary | ICD-10-CM | POA: Diagnosis not present

## 2023-11-07 DIAGNOSIS — O403XX0 Polyhydramnios, third trimester, not applicable or unspecified: Secondary | ICD-10-CM | POA: Diagnosis not present

## 2023-11-07 DIAGNOSIS — O09293 Supervision of pregnancy with other poor reproductive or obstetric history, third trimester: Secondary | ICD-10-CM

## 2023-11-07 DIAGNOSIS — E669 Obesity, unspecified: Secondary | ICD-10-CM | POA: Diagnosis not present

## 2023-11-07 DIAGNOSIS — Z3A32 32 weeks gestation of pregnancy: Secondary | ICD-10-CM

## 2023-11-14 ENCOUNTER — Ambulatory Visit (INDEPENDENT_AMBULATORY_CARE_PROVIDER_SITE_OTHER): Admitting: Advanced Practice Midwife

## 2023-11-14 ENCOUNTER — Encounter: Payer: Self-pay | Admitting: Advanced Practice Midwife

## 2023-11-14 VITALS — BP 126/71 | HR 88 | Wt 218.0 lb

## 2023-11-14 DIAGNOSIS — Z98891 History of uterine scar from previous surgery: Secondary | ICD-10-CM | POA: Diagnosis not present

## 2023-11-14 DIAGNOSIS — R102 Pelvic and perineal pain: Secondary | ICD-10-CM

## 2023-11-14 DIAGNOSIS — O0990 Supervision of high risk pregnancy, unspecified, unspecified trimester: Secondary | ICD-10-CM | POA: Diagnosis not present

## 2023-11-14 DIAGNOSIS — O26893 Other specified pregnancy related conditions, third trimester: Secondary | ICD-10-CM

## 2023-11-14 DIAGNOSIS — Z3A33 33 weeks gestation of pregnancy: Secondary | ICD-10-CM | POA: Diagnosis not present

## 2023-11-14 DIAGNOSIS — O365990 Maternal care for other known or suspected poor fetal growth, unspecified trimester, not applicable or unspecified: Secondary | ICD-10-CM | POA: Diagnosis not present

## 2023-11-14 DIAGNOSIS — O409XX0 Polyhydramnios, unspecified trimester, not applicable or unspecified: Secondary | ICD-10-CM

## 2023-11-14 NOTE — Progress Notes (Signed)
   PRENATAL VISIT NOTE  Subjective:  Grace Harper is a 28 y.o. [id number] at [redacted]w[redacted]d being seen today for ongoing prenatal care.  She is currently monitored for the following issues for this high-risk pregnancy and has Generalized headaches; Prediabetes; Supervision of high risk pregnancy, antepartum; H/O pre-eclampsia in prior pregnancy, currently pregnant; Hx of cesarean section; Alpha thalassemia silent carrier; Obesity affecting pregnancy; Intrauterine growth restriction (IUGR) affecting care of mother; and Excessive weight gain affecting pregnancy on their problem list.  Patient reports  intermittent pelvic pressure .  Contractions: Irritability. Vag. Bleeding: None.  Movement: Present. Denies leaking of fluid.   The following portions of the patient's history were reviewed and updated as appropriate: allergies, current medications, past family history, past medical history, past social history, past surgical history and problem list.   Objective:   Vitals:   11/14/23 1503  BP: 126/71  Pulse: 88  Weight: 218 lb (98.9 kg)    Fetal Status: Fetal Heart Rate (bpm): 135   Movement: Present     General:  Alert, oriented and cooperative. Patient is in no acute distress.  Skin: Skin is warm and dry. No rash noted.   Cardiovascular: Normal heart rate noted  Respiratory: Normal respiratory effort, no problems with respiration noted  Abdomen: Soft, gravid, appropriate for gestational age.  Pain/Pressure: Present     Pelvic: Cervical exam deferred        Extremities: Normal range of motion.  Edema: Trace  Mental Status: Normal mood and affect. Normal behavior. Normal judgment and thought content.   Assessment and Plan:  Pregnancy: G4P1021 at [redacted]w[redacted]d 1. Supervision of high risk pregnancy, antepartum (Primary) --Anticipatory guidance about next visits/weeks of pregnancy given.   2. Poor fetal growth affecting management of mother, antepartum, single or unspecified fetus --EFW 15% on last [place],  [place] scheduled 3/26 --FH 33 today, wnl but pt did have polyhydramnios as well  3. Hx of cesarean section --considering TOLAC  4. [redacted] weeks gestation of pregnancy  5. Polyhydramnios affecting pregnancy   6. Pelvic pressure in pregnancy, antepartum, third trimester --Rest/ice/heat/warm bath/increase PO fluids/Tylenol/pregnancy support belt    Preterm labor symptoms and general obstetric precautions including but not limited to vaginal bleeding, contractions, leaking of fluid and fetal movement were reviewed in detail with the patient. Please refer to After Visit Summary for other counseling recommendations.   No follow-ups on file.  Future Appointments  Date Time Provider Department Center  11/15/2023  2:15 PM [facility] NURSE [facility]  11/15/2023  2:30 PM [facility] US2 [facility]  11/22/2023  1:00 PM [facility] PROVIDER 1 [facility]  11/22/2023  1:30 PM [facility] US3 [facility]    Sharen Counter, CNM

## 2023-11-14 NOTE — Progress Notes (Signed)
 Pt presents for ROB visit. Pt c/o vaginal pressure and back pain.

## 2023-11-15 ENCOUNTER — Ambulatory Visit: Attending: Obstetrics and Gynecology | Admitting: *Deleted

## 2023-11-15 ENCOUNTER — Other Ambulatory Visit: Payer: Self-pay | Admitting: *Deleted

## 2023-11-15 ENCOUNTER — Ambulatory Visit

## 2023-11-15 VITALS — BP 113/58 | HR 93

## 2023-11-15 DIAGNOSIS — Z148 Genetic carrier of other disease: Secondary | ICD-10-CM | POA: Insufficient documentation

## 2023-11-15 DIAGNOSIS — E669 Obesity, unspecified: Secondary | ICD-10-CM | POA: Diagnosis not present

## 2023-11-15 DIAGNOSIS — O99213 Obesity complicating pregnancy, third trimester: Secondary | ICD-10-CM | POA: Insufficient documentation

## 2023-11-15 DIAGNOSIS — O365930 Maternal care for other known or suspected poor fetal growth, third trimester, not applicable or unspecified: Secondary | ICD-10-CM

## 2023-11-15 DIAGNOSIS — O09293 Supervision of pregnancy with other poor reproductive or obstetric history, third trimester: Secondary | ICD-10-CM | POA: Insufficient documentation

## 2023-11-15 DIAGNOSIS — O403XX0 Polyhydramnios, third trimester, not applicable or unspecified: Secondary | ICD-10-CM | POA: Insufficient documentation

## 2023-11-15 DIAGNOSIS — O365990 Maternal care for other known or suspected poor fetal growth, unspecified trimester, not applicable or unspecified: Secondary | ICD-10-CM

## 2023-11-15 DIAGNOSIS — Z3A33 33 weeks gestation of pregnancy: Secondary | ICD-10-CM

## 2023-11-15 DIAGNOSIS — O34219 Maternal care for unspecified type scar from previous cesarean delivery: Secondary | ICD-10-CM | POA: Diagnosis not present

## 2023-11-15 DIAGNOSIS — D563 Thalassemia minor: Secondary | ICD-10-CM

## 2023-11-15 DIAGNOSIS — O409XX0 Polyhydramnios, unspecified trimester, not applicable or unspecified: Secondary | ICD-10-CM

## 2023-11-15 DIAGNOSIS — O0990 Supervision of high risk pregnancy, unspecified, unspecified trimester: Secondary | ICD-10-CM

## 2023-11-22 ENCOUNTER — Ambulatory Visit

## 2023-11-22 ENCOUNTER — Other Ambulatory Visit

## 2023-11-24 ENCOUNTER — Ambulatory Visit (HOSPITAL_BASED_OUTPATIENT_CLINIC_OR_DEPARTMENT_OTHER): Admitting: *Deleted

## 2023-11-24 ENCOUNTER — Ambulatory Visit: Attending: Maternal & Fetal Medicine | Admitting: *Deleted

## 2023-11-24 VITALS — BP 112/58 | HR 88

## 2023-11-24 DIAGNOSIS — O365930 Maternal care for other known or suspected poor fetal growth, third trimester, not applicable or unspecified: Secondary | ICD-10-CM | POA: Insufficient documentation

## 2023-11-24 DIAGNOSIS — Z3A34 34 weeks gestation of pregnancy: Secondary | ICD-10-CM | POA: Insufficient documentation

## 2023-11-24 DIAGNOSIS — D563 Thalassemia minor: Secondary | ICD-10-CM

## 2023-11-24 DIAGNOSIS — O0990 Supervision of high risk pregnancy, unspecified, unspecified trimester: Secondary | ICD-10-CM

## 2023-11-24 DIAGNOSIS — O99213 Obesity complicating pregnancy, third trimester: Secondary | ICD-10-CM

## 2023-11-24 NOTE — Procedures (Signed)
 Grace Harper Apr 14, 1996 [redacted]w[redacted]d  Fetus A Non-Stress Test Interpretation for 11/24/23   NST only  Indication:  resolved polyhydramnios, prev FGR  Fetal Heart Rate A Mode: External Baseline Rate (A): 125 bpm Variability: Moderate Accelerations: 15 x 15 Decelerations: None Multiple birth?: No  Uterine Activity Mode: Palpation, Toco Contraction Frequency (min): ui Resting Tone Palpated: Relaxed  Interpretation (Fetal Testing) Nonstress Test Interpretation: Reactive Overall Impression: Reassuring for gestational age Comments: Dr. Parke Poisson reviewed tracing

## 2023-11-28 ENCOUNTER — Encounter: Payer: Self-pay | Admitting: Advanced Practice Midwife

## 2023-11-28 ENCOUNTER — Ambulatory Visit (INDEPENDENT_AMBULATORY_CARE_PROVIDER_SITE_OTHER): Admitting: Advanced Practice Midwife

## 2023-11-28 VITALS — BP 129/68 | HR 88 | Wt 222.8 lb

## 2023-11-28 DIAGNOSIS — O365990 Maternal care for other known or suspected poor fetal growth, unspecified trimester, not applicable or unspecified: Secondary | ICD-10-CM

## 2023-11-28 DIAGNOSIS — O26893 Other specified pregnancy related conditions, third trimester: Secondary | ICD-10-CM

## 2023-11-28 DIAGNOSIS — O0990 Supervision of high risk pregnancy, unspecified, unspecified trimester: Secondary | ICD-10-CM

## 2023-11-28 DIAGNOSIS — Z98891 History of uterine scar from previous surgery: Secondary | ICD-10-CM

## 2023-11-28 DIAGNOSIS — O409XX0 Polyhydramnios, unspecified trimester, not applicable or unspecified: Secondary | ICD-10-CM | POA: Diagnosis not present

## 2023-11-28 DIAGNOSIS — R102 Pelvic and perineal pain: Secondary | ICD-10-CM

## 2023-11-28 DIAGNOSIS — O99210 Obesity complicating pregnancy, unspecified trimester: Secondary | ICD-10-CM | POA: Diagnosis not present

## 2023-11-28 NOTE — Progress Notes (Signed)
   PRENATAL VISIT NOTE  Subjective:  Grace Harper is a 28 y.o. [id number] at [redacted]w[redacted]d being seen today for ongoing prenatal care.  She is currently monitored for the following issues for this high-risk pregnancy and has Generalized headaches; Prediabetes; Supervision of high risk pregnancy, antepartum; H/O pre-eclampsia in prior pregnancy, currently pregnant; Hx of cesarean section; Alpha thalassemia silent carrier; Obesity affecting pregnancy; Intrauterine growth restriction (IUGR) affecting care of mother; and Excessive weight gain affecting pregnancy on their problem list.  Patient reports  pelvic pressure .  Contractions: Irritability. Vag. Bleeding: None.  Movement: Present. Denies leaking of fluid.   The following portions of the patient's history were reviewed and updated as appropriate: allergies, current medications, past family history, past medical history, past social history, past surgical history and problem list.   Objective:   Vitals:   11/28/23 0834  BP: 129/68  Pulse: 88  Weight: 222 lb 12.8 oz (101.1 kg)    Fetal Status: Fetal Heart Rate (bpm): 128 Fundal Height: 35 cm Movement: Present     General:  Alert, oriented and cooperative. Patient is in no acute distress.  Skin: Skin is warm and dry. No rash noted.   Cardiovascular: Normal heart rate noted  Respiratory: Normal respiratory effort, no problems with respiration noted  Abdomen: Soft, gravid, appropriate for gestational age.  Pain/Pressure: Present     Pelvic: Cervical exam deferred        Extremities: Normal range of motion.  Edema: Mild pitting, slight indentation  Mental Status: Normal mood and affect. Normal behavior. Normal judgment and thought content.   Assessment and Plan:  Pregnancy: G4P1021 at [redacted]w[redacted]d 1. Hx of cesarean section (Primary) --Desires TOLAC, appt with MD next week to sign consent --Reviewed risks of VBAC and risks of repeat cesarean with pt today briefly  2. Supervision of high risk  pregnancy, antepartum --Anticipatory guidance about next visits/weeks of pregnancy given.   3. Polyhydramnios affecting pregnancy   4. Obesity affecting pregnancy, antepartum, unspecified obesity type --Prepregnancy BMI 35  5. Poor fetal growth affecting management of mother, antepartum, single or unspecified fetus --EFW 15% on last [place], antenatal testing/dopplers d/c'd.  --Growth on 4/11 --Delivery at 39 weeks per MFM --IOL scheduled    Preterm labor symptoms and general obstetric precautions including but not limited to vaginal bleeding, contractions, leaking of fluid and fetal movement were reviewed in detail with the patient. Please refer to After Visit Summary for other counseling recommendations.   Return in about 1 week (around 12/05/2023) for MD only.  Future Appointments  Date Time Provider Department Center  12/01/2023  2:00 PM [facility] PROVIDER 1 [facility]  12/01/2023  2:30 PM [facility] US1 [facility]  12/06/2023  1:50 PM Dunn, Gillian Scarce, MD [facility] None    Sharen Counter, CNM

## 2023-11-28 NOTE — Progress Notes (Signed)
 No concerns.

## 2023-12-01 ENCOUNTER — Other Ambulatory Visit: Payer: Self-pay | Admitting: *Deleted

## 2023-12-01 ENCOUNTER — Other Ambulatory Visit: Payer: Self-pay | Admitting: Obstetrics and Gynecology

## 2023-12-01 ENCOUNTER — Ambulatory Visit

## 2023-12-01 ENCOUNTER — Ambulatory Visit: Attending: Maternal & Fetal Medicine | Admitting: Obstetrics and Gynecology

## 2023-12-01 VITALS — BP 124/57

## 2023-12-01 DIAGNOSIS — O99013 Anemia complicating pregnancy, third trimester: Secondary | ICD-10-CM

## 2023-12-01 DIAGNOSIS — O365930 Maternal care for other known or suspected poor fetal growth, third trimester, not applicable or unspecified: Secondary | ICD-10-CM

## 2023-12-01 DIAGNOSIS — Z148 Genetic carrier of other disease: Secondary | ICD-10-CM | POA: Insufficient documentation

## 2023-12-01 DIAGNOSIS — Z3A35 35 weeks gestation of pregnancy: Secondary | ICD-10-CM

## 2023-12-01 DIAGNOSIS — O34219 Maternal care for unspecified type scar from previous cesarean delivery: Secondary | ICD-10-CM | POA: Insufficient documentation

## 2023-12-01 DIAGNOSIS — O409XX0 Polyhydramnios, unspecified trimester, not applicable or unspecified: Secondary | ICD-10-CM

## 2023-12-01 DIAGNOSIS — O09293 Supervision of pregnancy with other poor reproductive or obstetric history, third trimester: Secondary | ICD-10-CM | POA: Diagnosis not present

## 2023-12-01 DIAGNOSIS — O403XX0 Polyhydramnios, third trimester, not applicable or unspecified: Secondary | ICD-10-CM

## 2023-12-01 DIAGNOSIS — E669 Obesity, unspecified: Secondary | ICD-10-CM | POA: Diagnosis not present

## 2023-12-01 DIAGNOSIS — O365990 Maternal care for other known or suspected poor fetal growth, unspecified trimester, not applicable or unspecified: Secondary | ICD-10-CM

## 2023-12-01 DIAGNOSIS — O0990 Supervision of high risk pregnancy, unspecified, unspecified trimester: Secondary | ICD-10-CM

## 2023-12-01 DIAGNOSIS — O99213 Obesity complicating pregnancy, third trimester: Secondary | ICD-10-CM

## 2023-12-01 DIAGNOSIS — D563 Thalassemia minor: Secondary | ICD-10-CM

## 2023-12-01 NOTE — Progress Notes (Signed)
 Maternal-Fetal Medicine Consultation GA-35 weeks and 4 days gestation.  Previously seen fetal growth restriction had resolved. Obstetric history significant for a term cesarean delivery.  Patient is very keen on VBAC.  Ultrasound On today's ultrasound, the estimated fetal weight is at the 18th percentile.  Amniotic fluid is normal good fetal activity seen.  Cephalic presentation.  Antenatal testing is reassuring.  BPP 8/8.  Patient had questions about vaginal birth after cesarean delivery.  She does not have hypertension.  Based on maternal-fetal medicine unit network calculator, her chances of successful vaginal delivery is 50%.  I counseled the patient that these calculators are helpful but should not be the sole deciding factor. Oxytocin induction carries a slightly increased risk of uterine scar rupture (2% to 3%).  Recommendations -Continue weekly antenatal testing. -Patient will be undergoing induction of labor at [redacted] weeks gestation.

## 2023-12-04 ENCOUNTER — Other Ambulatory Visit: Payer: Self-pay | Admitting: *Deleted

## 2023-12-04 DIAGNOSIS — O365990 Maternal care for other known or suspected poor fetal growth, unspecified trimester, not applicable or unspecified: Secondary | ICD-10-CM

## 2023-12-06 ENCOUNTER — Ambulatory Visit (HOSPITAL_BASED_OUTPATIENT_CLINIC_OR_DEPARTMENT_OTHER): Admitting: Obstetrics and Gynecology

## 2023-12-06 ENCOUNTER — Other Ambulatory Visit
Admission: RE | Admit: 2023-12-06 | Discharge: 2023-12-06 | Disposition: A | Discharge status: Home / Self Care | Source: Ambulatory Visit | Attending: Obstetrics and Gynecology | Admitting: Obstetrics and Gynecology

## 2023-12-06 ENCOUNTER — Ambulatory Visit: Admitting: *Deleted

## 2023-12-06 ENCOUNTER — Ambulatory Visit: Admitting: Obstetrics and Gynecology

## 2023-12-06 VITALS — BP 110/71 | HR 91 | Wt 225.0 lb

## 2023-12-06 VITALS — BP 118/63 | HR 80

## 2023-12-06 DIAGNOSIS — O365930 Maternal care for other known or suspected poor fetal growth, third trimester, not applicable or unspecified: Secondary | ICD-10-CM | POA: Insufficient documentation

## 2023-12-06 DIAGNOSIS — O99213 Obesity complicating pregnancy, third trimester: Secondary | ICD-10-CM

## 2023-12-06 DIAGNOSIS — O34219 Maternal care for unspecified type scar from previous cesarean delivery: Secondary | ICD-10-CM | POA: Diagnosis not present

## 2023-12-06 DIAGNOSIS — Z3A36 36 weeks gestation of pregnancy: Secondary | ICD-10-CM | POA: Insufficient documentation

## 2023-12-06 DIAGNOSIS — O09299 Supervision of pregnancy with other poor reproductive or obstetric history, unspecified trimester: Secondary | ICD-10-CM | POA: Diagnosis not present

## 2023-12-06 DIAGNOSIS — E669 Obesity, unspecified: Secondary | ICD-10-CM

## 2023-12-06 DIAGNOSIS — D563 Thalassemia minor: Secondary | ICD-10-CM | POA: Diagnosis not present

## 2023-12-06 DIAGNOSIS — O0990 Supervision of high risk pregnancy, unspecified, unspecified trimester: Secondary | ICD-10-CM | POA: Insufficient documentation

## 2023-12-06 NOTE — Progress Notes (Signed)
 Pt complains of possible loss of mucous plug, low back pain and cramping.

## 2023-12-06 NOTE — Progress Notes (Signed)
 PRENATAL VISIT NOTE  Subjective:  Grace Harper is a 28 y.o. [id number] at [redacted]w[redacted]d being seen today for ongoing prenatal care.  She is currently monitored for the following issues for this high-risk pregnancy and has Generalized headaches; Prediabetes; Supervision of high risk pregnancy, antepartum; H/O pre-eclampsia in prior pregnancy, currently pregnant; Hx of cesarean section; Alpha thalassemia silent carrier; Obesity affecting pregnancy; Intrauterine growth restriction (IUGR) affecting care of mother; and Excessive weight gain affecting pregnancy on their problem list.  IOL scheduled for 5/5  Patient reports fatigue and pressure.  Contractions: Irregular. Vag. Bleeding: None.  Movement: Present. Denies leaking of fluid.   Interested in [facility].  The following portions of the patient's history were reviewed and updated as appropriate: allergies, current medications, past family history, past medical history, past social history, past surgical history and problem list.   Objective:   Vitals:   12/06/23 1401  BP: 110/71  Pulse: 91  Weight: 225 lb (102.1 kg)   Body mass index is 43.94 kg/m. Total weight gain: 45 lb (20.4 kg)   Fetal Status: Fetal Heart Rate (bpm): 155 Fundal Height: 36 cm Movement: Present  Presentation: Vertex  General:  Alert, oriented and cooperative. Patient is in no acute distress.  Skin: Skin is warm and dry. No rash noted.   Cardiovascular: Normal heart rate noted  Respiratory: Normal respiratory effort, no problems with respiration noted  Abdomen: Soft, gravid, appropriate for gestational age.  Pain/Pressure: Present     Pelvic: Cervical exam performed in the presence of a chaperone Dilation: Fingertip Effacement (%): 30 Station: -3  Extremities: Normal range of motion.     Mental Status: Normal mood and affect. Normal behavior. Normal judgment and thought content.   Assessment and Plan:  Pregnancy: G4P1021 at [redacted]w[redacted]d 1. Supervision of high risk  pregnancy, antepartum (Primary) Doing well, IOL 5/5 due to previous IUGR - Culture, beta strep (group b only) - Cervicovaginal ancillary only( [place])  2. History of cesarean delivery, currently pregnant We discussed her history of c-section. Her previous c-section was due to  arrest of dilation at 3 cm.  She has a history of  no prior successful vaginal deliveries. We reviewed the risks/benefits/alternatives of trial of labor after cesarean section (TOLAC) versus repeat cesarean section as outlined in the VBAC (vaginal birth after cesarean section) consent form.  We discussed the risks associated with repeat c-section including but not limited to bleeding, infection, damage to surrounding structures/organs such as bowel, bladder, muscles, arteries, nerves, prolonged recovery. We discussed the risks associated with TOLAC. The risk of uterine rupture is about 0.5-1% with spontaneous labor and higher with pitocin augmentation or induction. We reviewed that with more than one prior cesarean, that risk is likely higher. We reviewed that if a uterine rupture were to occur, it is a surgical emergency and carries risks including but not limited to maternal hemorrhage, blood transfusion, hysterectomy and fetal risks of severe morbidity or mortality including brain damage or death.    - After considering her options, she would like to [facility] - VBAC consent form provided to the patient and she signed it to indicate her desire for TOLAC   3. H/O pre-eclampsia in prior pregnancy, currently pregnant Continue aspirin. BP within normal limits today  4. Alpha thalassemia silent carrier   5. [redacted] weeks gestation of pregnancy  - Culture, beta strep (group b only) - Cervicovaginal ancillary only( [place])   Preterm labor symptoms and general obstetric precautions including but not limited  to vaginal bleeding, contractions, leaking of fluid and fetal movement were reviewed in detail with the  patient. Please refer to After Visit Summary for other counseling recommendations.   Return in about 1 week (around 12/13/2023).  Future Appointments  Date Time Provider Department Center  12/14/2023 10:15 AM [facility] US2 [facility]  12/25/2023  6:30 AM [facility] SCHED ROOM [facility] None    Marci Setter, MD

## 2023-12-06 NOTE — Procedures (Addendum)
 Grace Harper [birth year] [redacted]w[redacted]d  Fetus A Non-Stress Test Interpretation for 12/06/23 (NST only)  Indication: IUGR and Obesity  Fetal Heart Rate A Mode: External Baseline Rate (A): 135 bpm Variability: Moderate Accelerations: 15 x 15 Decelerations: None Multiple birth?: No  Uterine Activity Mode: Palpation, Toco Contraction Frequency (min): UI Contraction Duration (sec): 20-30 Contraction Quality: Mild Resting Tone Palpated: Relaxed Resting Time: Adequate  Interpretation (Fetal Testing) Nonstress Test Interpretation: Reactive Comments: Dr. Nolan Battle reviewed tracing.

## 2023-12-07 LAB — CERVICOVAGINAL ANCILLARY ONLY
Chlamydia: NEGATIVE
Comment: NEGATIVE
Comment: NEGATIVE
Comment: NORMAL
Neisseria Gonorrhea: NEGATIVE
Trichomonas: NEGATIVE

## 2023-12-09 LAB — CULTURE, BETA STREP (GROUP B ONLY): Strep Gp B Culture: POSITIVE — AB

## 2023-12-12 ENCOUNTER — Ambulatory Visit: Admitting: Obstetrics & Gynecology

## 2023-12-12 ENCOUNTER — Encounter: Payer: Self-pay | Admitting: Obstetrics and Gynecology

## 2023-12-12 VITALS — BP 107/71 | HR 85 | Wt 227.0 lb

## 2023-12-12 DIAGNOSIS — O365930 Maternal care for other known or suspected poor fetal growth, third trimester, not applicable or unspecified: Secondary | ICD-10-CM

## 2023-12-12 DIAGNOSIS — Z98891 History of uterine scar from previous surgery: Secondary | ICD-10-CM | POA: Diagnosis not present

## 2023-12-12 DIAGNOSIS — O0990 Supervision of high risk pregnancy, unspecified, unspecified trimester: Secondary | ICD-10-CM | POA: Diagnosis not present

## 2023-12-12 NOTE — Progress Notes (Signed)
   PRENATAL VISIT NOTE  Subjective:  Grace Harper is a 28 y.o. Z6X0960 at [redacted]w[redacted]d being seen today for ongoing prenatal care.  She is currently monitored for the following issues for this high-risk pregnancy and has Generalized headaches; Prediabetes; Supervision of high risk pregnancy, antepartum; H/O pre-eclampsia in prior pregnancy, currently pregnant; Hx of cesarean section; Alpha thalassemia silent carrier; Obesity affecting pregnancy; Intrauterine growth restriction (IUGR) affecting care of mother; and Excessive weight gain affecting pregnancy on their problem list.  Patient reports no complaints.  Contractions: Irregular. Vag. Bleeding: None.  Movement: Present. Denies leaking of fluid.   The following portions of the patient's history were reviewed and updated as appropriate: allergies, current medications, past family history, past medical history, past social history, past surgical history and problem list.   Objective:   Vitals:   12/12/23 0959  BP: 107/71  Pulse: 85  Weight: 227 lb (103 kg)    Fetal Status: Fetal Heart Rate (bpm): 150   Movement: Present  Presentation: Vertex  General:  Alert, oriented and cooperative. Patient is in no acute distress.  Skin: Skin is warm and dry. No rash noted.   Cardiovascular: Normal heart rate noted  Respiratory: Normal respiratory effort, no problems with respiration noted  Abdomen: Soft, gravid, appropriate for gestational age.  Pain/Pressure: Present     Pelvic: Cervical exam performed in the presence of a chaperone Dilation: Fingertip Effacement (%): 40 Station: -2  Extremities: Normal range of motion.  Edema: Mild pitting, slight indentation  Mental Status: Normal mood and affect. Normal behavior. Normal judgment and thought content.   Assessment and Plan:  Pregnancy: G4P1021 at [redacted]w[redacted]d 1. Supervision of high risk pregnancy, antepartum (Primary) IUGR resolved 15 % ile, has f/u US  Supervision of high risk pregnancy,  antepartum  Hx of cesarean section  Poor fetal growth affecting management of mother in third trimester, single or unspecified fetus Plans TOLAC, IOL 5/5 is scheduled  Term labor symptoms and general obstetric precautions including but not limited to vaginal bleeding, contractions, leaking of fluid and fetal movement were reviewed in detail with the patient. Please refer to After Visit Summary for other counseling recommendations.   Return in about 1 week (around 12/19/2023).  Future Appointments  Date Time Provider Department Center  12/14/2023  2:35 PM [facility] US2 [facility]  12/19/2023 11:15 AM Gabrielle Joiner, MD [facility] None  12/25/2023  6:30 AM [facility] ROOM [facility] None    Onnie Bilis, MD

## 2023-12-12 NOTE — Progress Notes (Signed)
 Pt had some amount of fluid yesterday. Pt also complains of swelling.  Pt has u/s on 4/24.

## 2023-12-13 ENCOUNTER — Telehealth (HOSPITAL_COMMUNITY): Payer: Self-pay | Admitting: *Deleted

## 2023-12-13 ENCOUNTER — Encounter (HOSPITAL_COMMUNITY): Payer: Self-pay | Admitting: *Deleted

## 2023-12-13 NOTE — Telephone Encounter (Signed)
 Preadmission screen

## 2023-12-14 ENCOUNTER — Other Ambulatory Visit

## 2023-12-14 ENCOUNTER — Ambulatory Visit (INDEPENDENT_AMBULATORY_CARE_PROVIDER_SITE_OTHER): Payer: Self-pay

## 2023-12-14 ENCOUNTER — Other Ambulatory Visit: Payer: Self-pay

## 2023-12-14 DIAGNOSIS — O365930 Maternal care for other known or suspected poor fetal growth, third trimester, not applicable or unspecified: Secondary | ICD-10-CM | POA: Diagnosis not present

## 2023-12-14 DIAGNOSIS — Z3A37 37 weeks gestation of pregnancy: Secondary | ICD-10-CM

## 2023-12-14 DIAGNOSIS — O365990 Maternal care for other known or suspected poor fetal growth, unspecified trimester, not applicable or unspecified: Secondary | ICD-10-CM

## 2023-12-19 ENCOUNTER — Ambulatory Visit: Admitting: Obstetrics

## 2023-12-19 VITALS — BP 122/70 | HR 88 | Wt 230.6 lb

## 2023-12-19 DIAGNOSIS — O09299 Supervision of pregnancy with other poor reproductive or obstetric history, unspecified trimester: Secondary | ICD-10-CM

## 2023-12-19 DIAGNOSIS — D563 Thalassemia minor: Secondary | ICD-10-CM

## 2023-12-19 DIAGNOSIS — O0990 Supervision of high risk pregnancy, unspecified, unspecified trimester: Secondary | ICD-10-CM

## 2023-12-19 DIAGNOSIS — O409XX0 Polyhydramnios, unspecified trimester, not applicable or unspecified: Secondary | ICD-10-CM

## 2023-12-19 DIAGNOSIS — Z98891 History of uterine scar from previous surgery: Secondary | ICD-10-CM | POA: Diagnosis not present

## 2023-12-19 DIAGNOSIS — O365930 Maternal care for other known or suspected poor fetal growth, third trimester, not applicable or unspecified: Secondary | ICD-10-CM

## 2023-12-19 DIAGNOSIS — O99210 Obesity complicating pregnancy, unspecified trimester: Secondary | ICD-10-CM

## 2023-12-19 NOTE — Progress Notes (Signed)
 Pt presents for rob. Pt has no questions or concerns at this time.

## 2023-12-19 NOTE — Progress Notes (Signed)
 Subjective:  Grace Harper is a 28 y.o. J1B1478 at [redacted]w[redacted]d being seen today for ongoing prenatal care.  She is currently monitored for the following issues for this high-risk pregnancy and has Generalized headaches; Prediabetes; Supervision of high risk pregnancy, antepartum; H/O pre-eclampsia in prior pregnancy, currently pregnant; Hx of cesarean section; Alpha thalassemia silent carrier; Obesity affecting pregnancy; Intrauterine growth restriction (IUGR) affecting care of mother; and Excessive weight gain affecting pregnancy on their problem list.  Patient reports no complaints.  Contractions: Irritability. Vag. Bleeding: None.  Movement: Present. Denies leaking of fluid.   The following portions of the patient's history were reviewed and updated as appropriate: allergies, current medications, past family history, past medical history, past social history, past surgical history and problem list. Problem list updated.  Objective:   Vitals:   12/19/23 1129  BP: 122/70  Pulse: 88  Weight: 230 lb 9.6 oz (104.6 kg)    Fetal Status: Fetal Heart Rate (bpm): 147   Movement: Present     General:  Alert, oriented and cooperative. Patient is in no acute distress.  Skin: Skin is warm and dry. No rash noted.   Cardiovascular: Normal heart rate noted  Respiratory: Normal respiratory effort, no problems with respiration noted  Abdomen: Soft, gravid, appropriate for gestational age. Pain/Pressure: Present     Pelvic:  Cervical exam deferred        Extremities: Normal range of motion.  Edema: Trace (Feet and hands)  Mental Status: Normal mood and affect. Normal behavior. Normal judgment and thought content.   Urinalysis:      MFM OB Follow Up Ultrasound:   US  MFM OB FOLLOW UP (Accession [id number]) (Order [id number]) Imaging Date: 12/01/2023 Department: [facility] Released By: Allen, Joyann, RDMS Authorizing: Cassandria Clever, MD   Exam Status  Status  Final  [99]   Related Results   US  MFM UA CORD DOPPLER Final result 12/01/2023    Narrative   ----------------------------------------------------------------------  OBSTETRICS REPORT                       (Signed Final 12/01/2023 05:38 pm) ---------------------------------------------------------------------- Patient Info   ID #:       [id number]                          D.O.B.:  1996/07/16 (28 yrs)(F)  Name:       Grace Harper [facility]                Visit Date: 12/01/2023 03:01 pm ---------------------------------------------------------------------- Performed By ...       US  MFM FETAL BPP WO NON STRESS Final result 12/01/2023    Narrative   ----------------------------------------------------------------------  OBSTETRICS REPORT                       (Signed Final 12/01/2023 05:38 pm) ---------------------------------------------------------------------- Patient Info   ID #:       [id number]                          D.O.B.:  1996/04/26 (28 yrs)(F)  Name:       Grace Harper [facility]                Visit Date: 12/01/2023 03:01 pm ---------------------------------------------------------------------- Performed By ...       Study Result  Narrative & Impression    ----------------------------------------------------------------------  OBSTETRICS REPORT                       (  Signed Final 12/01/2023 05:38 pm) ---------------------------------------------------------------------- Patient Info    ID #:       [id number]                          D.O.B.:  07-22-1996 (28 yrs)(F)  Name:       Grace Harper [facility]                Visit Date: 12/01/2023 03:01 pm ---------------------------------------------------------------------- Performed By    Attending:        Cassandria Clever MD        Ref. Address:     [address]                                                             [place], [place]                                                              [address]  Performed By:     Lylia Sand RDMS      [facility]  Referred By:      [facility] ---------------------------------------------------------------------- Orders    #  Description                           Code        Ordered By  1  US  MFM UA CORD DOPPLER                76820.02    RAVI SHANKAR  2  US  MFM FETAL BPP WO NON               76819.01    RAVI SHANKAR     STRESS  3  US  MFM OB FOLLOW UP                   [id number]    RAVI SHANKAR ----------------------------------------------------------------------    #  Order #                     Accession #                Episode #  1  [id number]                   [id number]                 [id number]  2  [id number]                   [id number]                 [id number]  3  [id number]                   [id number]                 [id number] ---------------------------------------------------------------------- Indications    Poor fetal growth, third trimester             O36.5930  Obesity complicating pregnancy, third          O99.213  trimester (BMI 35)  Polyhydramnios, third trimester, antepartum    O40.3XX0  condition or complication, unspecified fetus  (resolved)  Genetic carrier Technical brewer Thal)    Z14.8  Poor obstetric history: Previous               O09.299  preeclampsia / eclampsia/gestational HTN  Previous cesarean delivery, antepartum         O34.219  Low Risk NIPS,Negative AFP  [redacted] weeks gestation of pregnancy                Z3A.35 ---------------------------------------------------------------------- Fetal Evaluation    Num Of Fetuses:         1  Fetal Heart Rate(bpm):  149  Cardiac Activity:       Observed  Presentation:           Cephalic  Placenta:                Posterior  P. Cord Insertion:      Previously seen  Amniotic Fluid  AFI FV:      Within normal limits    AFI Sum(cm)     %Tile       Largest Pocket(cm)  17.73           66          6.55    RUQ(cm)       RLQ(cm)       LUQ(cm)        LLQ(cm)  3.58          3.37          4.23           6.55 ---------------------------------------------------------------------- Biophysical Evaluation    Amniotic F.V:   Within normal limits       F. Tone:        Observed  F. Movement:    Observed  Score:          8/8  F. Breathing:   Observed ---------------------------------------------------------------------- Biometry    BPD:      84.5  mm     G. Age:  34w 0d         15  %    CI:         76.1   %    70 - 86                                                          FL/HC:      21.7   %    20.1 - 22.1  HC:       307   mm     G. Age:  34w 2d        3.2  %    HC/AC:      1.01        0.93 - 1.11  AC:       305   mm     G. Age:  34w 3d         26  %    FL/BPD:     78.8   %    71 - 87  FL:       66.6  mm     G. Age:  34w 2d         15  %    FL/AC:      21.8   %    20 - 24    Est. FW:    2406  gm      5 lb 5 oz     18  % ---------------------------------------------------------------------- OB History    Gravidity:    4         Term:   1        Prem:   0        SAB:   0  TOP:          2       Ectopic:  0        Living: 1 ---------------------------------------------------------------------- Gestational Age    LMP:           36w 1d        Date:  03/23/23                   EDD:   12/28/23  U/S Today:     34w 2d                                        EDD:   01/10/24  Best:          35w 4d     Det. ByDelphine Fiedler         EDD:   01/01/24                                      (06/12/23) ---------------------------------------------------------------------- Anatomy    Cranium:               Previously seen  Aortic Arch:            Previously seen  Cavum:                  Previously seen        Ductal Arch:            Previously seen  Ventricles:            Appears normal         Diaphragm:              Appears normal  Choroid Plexus:        Previously seen        Stomach:                Appears normal, left                                                                        sided  Cerebellum:            Previously seen        Abdomen:                Previously seen  Posterior Fossa:       Previously seen        Abdominal Wall:         Previously seen  Face:                  Orbits and profile     Cord Vessels:           Previously seen                         previously seen  Lips:                  Previously seen        Kidneys:                Appear normal  Thoracic:              Previously seen        Bladder:                Appears normal  Heart:                 Appears normal         Spine:                  Previously seen                         (4CH, axis, and                         situs)  RVOT:                  Previously seen        Upper Extremities:      Previously seen  LVOT:                  Previously seen        Lower Extremities:  Previously seen ---------------------------------------------------------------------- Doppler - Fetal Vessels  Umbilical Artery   S/D     %tile      RI    %tile                      PSV    ADFV    RDFV                                                     (cm/s)   2.68       66    0.63       73                     54.98      No      No   ---------------------------------------------------------------------- Cervix Uterus Adnexa    Cervix  Not visualized (advanced GA >24wks)    Uterus  No abnormality visualized.    Right Ovary  Not visualized.    Left Ovary  Not visualized.    Cul De Sac  No free fluid seen.    Adnexa  No abnormality visualized ---------------------------------------------------------------------- Impression    GA-35 weeks and 4 days gestation.    Previously seen fetal  growth restriction had resolved.  Obstetric history significant for a term cesarean delivery.  Patient is very keen on VBAC.    Ultrasound  On today's ultrasound, the estimated fetal weight is at the  18th percentile.  Amniotic fluid is normal good fetal activity  seen.  Cephalic presentation.  Antenatal testing is reassuring.  BPP 8/8.    Patient had questions about vaginal birth after cesarean  delivery.  She does not have hypertension.  Based on  maternal-fetal medicine unit network calculator, her chances  of successful vaginal delivery is 50%.  I counseled the patient  that these calculators are helpful but should not be the sole  deciding factor.  Oxytocin  induction carries a slightly increased risk of uterine  scar rupture (2% to 3%).   ---------------------------------------------------------------------- Recommendations    -Continue weekly antenatal testing.  -Patient will be undergoing induction of labor at [redacted] weeks  gestation. ----------------------------------------------------------------------                 Cassandria Clever, MD Electronically Signed Final Report   12/01/2023 05:38 pm ----------------------------------------------------------------------       Assessment and Plan:  Pregnancy: U1L2440 at [redacted]w[redacted]d  1. Supervision of high risk pregnancy, antepartum (Primary)  2. Hx of cesarean section - desires VBAC.  Consents signs.  3. H/O pre-eclampsia in prior pregnancy, currently pregnant - clinically stable  4. Poor fetal growth affecting management of mother in third trimester, single or unspecified fetus, resolved - EFW at 18th percentile - IOL at 39 weeks, per Dr. Arnie Bibber on 12/01/2023  5. Polyhydramnios affecting pregnancy, resolved - AFV now normal  6. Alpha thalassemia silent carrier  7. Obesity affecting pregnancy, antepartum, unspecified obesity type    Term labor symptoms and general obstetric precautions including but not limited to vaginal  bleeding, contractions, leaking of fluid and fetal movement were reviewed in detail with the patient. Please refer to After Visit Summary for other counseling recommendations.   Return in about 6 weeks (around 01/30/2024) for postpartum visit.   Gabrielle Joiner, MD 12/19/2023

## 2023-12-24 NOTE — H&P (Signed)
 OBSTETRIC ADMISSION HISTORY AND PHYSICAL  Grace Harper is a 28 y.o. female [phone] with IUP at [redacted]w[redacted]d by US  presenting for TOLAC IOL for resolved IUGR per MFM recommendations. She reports +FMs, No LOF, no VB, no blurry vision, headaches or peripheral edema, and RUQ pain.  She plans on both breast and bottle feeding. She request OCPs for birth control. She received her prenatal care at  [facility]     Dating: By US  --->  Estimated Date of Delivery: 01/01/24  Sono:    @[redacted]w[redacted]d , CWD, normal anatomy, cephalic presentation, posterior placenta lie, 2406g, 18% EFW, BPP 8/8   Prenatal History/Complications:  Patient Active Problem List   Diagnosis Date Noted   Excessive weight gain affecting pregnancy 10/17/2023   Intrauterine growth restriction (IUGR) affecting care of mother 10/14/2023   Obesity affecting pregnancy 08/21/2023   Alpha thalassemia silent carrier 06/22/2023   H/O pre-eclampsia in prior pregnancy, currently pregnant 06/20/2023   Hx of cesarean section 06/20/2023   Supervision of high risk pregnancy, antepartum 06/12/2023   Prediabetes 09/16/2015   Generalized headaches 09/04/2015   NURSING  PROVIDER  Office Location [facility] Dating by LMP c/w U/S at 11 wks  [facility] Model Traditional Anatomy U/S incomplete  Initiated care at  [facility]  English               LAB RESULTS   Support Person [facility] [employer] NIPS: low risks AFP:       NT/IT (FT only)        Carrier Screen Horizon:   Rhogam  B/Positive/-- (10/21 1102) A1C/GTT Early:  Third trimester:   Flu Vaccine No      TDaP Vaccine Wants to wait til next visit Blood Type B/Positive/-- (10/21 1102)  Covid Vaccine Yes Antibody Negative (10/21 1102)  RSV Vaccine   Rubella 11.40 (10/21 1102)  Feeding Plan undecided RPR Non Reactive (10/21 1102)  Contraception oral contraceptives (estrogen/progesterone) HBsAg Negative (10/21 1102)  Circumcision N/A HIV Non Reactive (10/21 1102)  Pediatrician   [facility]  HCVAb Non Reactive (10/21 1102)  Prenatal Classes            Pap No results found for: "DIAGPAP"  BTL Consent   GC/CT Initial:   36wks:    VBAC Consent   GBS For PCN allergy, check sensitivities            DME Rx [X]  BP cuff [ ]  Weight Scale Waterbirth  [ ]  Class [ ]  Consent [ ]  CNM visit  PHQ9 & GAD7 [X]  new OB [X]  28 weeks  [  ] 36 weeks Induction  [ ]  Orders Entered [ ] Foley Y/N     Past Medical History: Past Medical History:  Diagnosis Date   Eczema    History of pre-eclampsia    Medical history non-contributory     Past Surgical History: Past Surgical History:  Procedure Laterality Date   CESAREAN SECTION N/A 05/08/2013   Procedure: CESAREAN SECTION;  Surgeon: Gabrielle Joiner, MD;  Location: [facility];  Service: Obstetrics;  Laterality: N/A;    Obstetrical History: OB History     Gravida  4   Para  1   Term  1   Preterm  0   AB  2   Living  1      SAB  0   IAB  2   Ectopic  0   Multiple  0   Live Births  1           Social History Social History   Socioeconomic History   Marital status: Single    Spouse name: Not on file   Number of children: Not on file   Years of education: Not on file   Highest education level: Not on file  Occupational History   Not on file  Tobacco Use   Smoking status: Never   Smokeless tobacco: Never  Vaping Use   Vaping status: Never Used  Substance and Sexual Activity   Alcohol use: Not Currently    Comment: occ   Drug use: Never   Sexual activity: Yes    Birth control/protection: None  Other Topics Concern   Not on file  Social History Narrative   Not on file   Social Drivers of Health   Financial Resource Strain: Not on file  Food Insecurity: Not on file  Transportation Needs: Not on file  Physical Activity: Not on file  Stress: Not on file  Social Connections: Not on file    Family History: Family History  Problem Relation Age of Onset   Diabetes Mother     Allergies: No Known  Allergies  No medications prior to admission.     Review of Systems   All systems reviewed and negative except as stated in HPI  Last menstrual period 03/23/2023. General appearance: alert, cooperative, and appears stated age Lungs: clear to auscultation bilaterally Heart: regular rate and rhythm Abdomen: soft, non-tender; bowel sounds normal Pelvic: normal female genitalia  Extremities: Homans sign is negative, no sign of DVT Presentation: cephalic Fetal monitoringBaseline: 130 bpm, Variability: Good {> 6 bpm), Accelerations: Reactive, and Decelerations: Absent Uterine activityNone     Prenatal labs: ABO, Rh: B/Positive/-- (10/21 1102) Antibody: Negative (10/21 1102) Rubella: 11.40 (10/21 1102) RPR: Non Reactive (02/25 0820)  HBsAg: Negative (10/21 1102)  HIV: Non Reactive (02/25 0820)  GBS: Positive/-- (04/16 1439)    Lab Results  Component Value Date   GBS Positive (A) 12/06/2023   GTT nrl Genetic screening  LR female NIPS, AFP neg, Horizon alph thal carrier  Anatomy US  IUGR now resolved   Immunization History  Administered Date(s) Administered   DTaP 12/15/1995, 03/14/1996, 05/24/1996, 01/17/1997, 11/02/1999   H1N1 07/31/2008   HIB (PRP-OMP) 12/15/1995, 03/14/1996, 05/24/1996, 10/17/1996, 01/17/1997   HPV Quadrivalent 07/31/2008, 11/26/2008, 03/18/2010   Hepatitis A 05/23/2008, 11/26/2008   Hepatitis B 26-Oct-1995, 12/15/1995, 04/24/1996   IPV 12/15/1995, 03/14/1996, 10/17/1996, 11/02/1999   Influenza Split 05/23/2008   Influenza,inj,Quad PF,6+ Mos 05/11/2013   MMR 10/17/1996, 11/02/1999   Meningococcal Conjugate 11/26/2008   Pneumococcal Conjugate-13 11/02/1999   Td 06/13/2007   Tdap 06/13/2007, 05/11/2013   Varicella 01/17/1997, 05/23/2008    Prenatal Transfer Tool  Maternal Diabetes: No Genetic Screening: Abnormal:  Results: Other: Alpha Thal carrier Maternal Ultrasounds/Referrals: IUGR Fetal Ultrasounds or other Referrals:  Referred to Materal  Fetal Medicine  Maternal Substance Abuse:  No Significant Maternal Medications:  None Significant Maternal Lab Results: Group B Strep positive Number of Prenatal Visits:greater than 3 verified prenatal visits Maternal Vaccinations: none Other Comments:  None   No results found for this or any previous visit (from the past 24 hours).  Patient Active Problem List   Diagnosis Date Noted   Excessive weight gain affecting pregnancy 10/17/2023   Intrauterine growth restriction (IUGR) affecting care of mother 10/14/2023   Obesity affecting pregnancy 08/21/2023   Alpha thalassemia silent carrier 06/22/2023   H/O  pre-eclampsia in prior pregnancy, currently pregnant 06/20/2023   Hx of cesarean section 06/20/2023   Supervision of high risk pregnancy, antepartum 06/12/2023   Prediabetes 09/16/2015   Generalized headaches 09/04/2015    Assessment/Plan:  Grace Harper is a 28 y.o. [id number] at [redacted]w[redacted]d here for TOLAC IOL per MFM recommendations for resolved FGR.   #Labor: start with [facility] FB placement.  80 Uterine and 40 Vaginal  #Pain: Per patient request #FWB: Cat 1 #GBS status:  Positive + PCN  #Feeding: Breastmilk  and Formula #Reproductive Life planning: Combination OCPs  #IUGR, resolved: @[redacted]w[redacted]d , CWD, normal anatomy, cephalic presentation, posterior placenta lie, 2406g, 18% EFW, BPP 8/8  #Obesity: BMI 45 kg/m2 #Hx of CS c/b hemorrhage: Failed IOL 2/2 PreE c/b thrombocytopenia after almost 72hrs CVE 2/80/-1, 3160g infant APGARs 1/8, QBL 1500cc - TXA at delivery   #Hx of PreE: PreE labs pending - f/u PreE labs - CTM Bps   Ebony Goldstein, MD  12/24/2023, 9:49 PM

## 2023-12-25 ENCOUNTER — Inpatient Hospital Stay (HOSPITAL_COMMUNITY)

## 2023-12-25 ENCOUNTER — Encounter (HOSPITAL_COMMUNITY)
Admission: RE | Disposition: A | Discharge status: Home / Self Care | Payer: Self-pay | Source: Home / Self Care | Attending: Family Medicine

## 2023-12-25 ENCOUNTER — Inpatient Hospital Stay (HOSPITAL_COMMUNITY)
Admission: RE | Admit: 2023-12-25 | Discharge: 2023-12-28 | DRG: 788 | Disposition: A | Discharge status: Home / Self Care | Attending: Obstetrics and Gynecology | Admitting: Obstetrics and Gynecology

## 2023-12-25 ENCOUNTER — Encounter (HOSPITAL_COMMUNITY): Payer: Self-pay | Admitting: Family Medicine

## 2023-12-25 ENCOUNTER — Inpatient Hospital Stay (HOSPITAL_COMMUNITY): Admitting: Anesthesiology

## 2023-12-25 ENCOUNTER — Other Ambulatory Visit: Payer: Self-pay

## 2023-12-25 DIAGNOSIS — Z148 Genetic carrier of other disease: Secondary | ICD-10-CM | POA: Diagnosis not present

## 2023-12-25 DIAGNOSIS — E66813 Obesity, class 3: Secondary | ICD-10-CM | POA: Diagnosis present

## 2023-12-25 DIAGNOSIS — Z3A39 39 weeks gestation of pregnancy: Secondary | ICD-10-CM | POA: Diagnosis not present

## 2023-12-25 DIAGNOSIS — O09299 Supervision of pregnancy with other poor reproductive or obstetric history, unspecified trimester: Secondary | ICD-10-CM

## 2023-12-25 DIAGNOSIS — Z833 Family history of diabetes mellitus: Secondary | ICD-10-CM | POA: Diagnosis not present

## 2023-12-25 DIAGNOSIS — O99214 Obesity complicating childbirth: Secondary | ICD-10-CM | POA: Diagnosis present

## 2023-12-25 DIAGNOSIS — O0990 Supervision of high risk pregnancy, unspecified, unspecified trimester: Secondary | ICD-10-CM

## 2023-12-25 DIAGNOSIS — O34211 Maternal care for low transverse scar from previous cesarean delivery: Secondary | ICD-10-CM | POA: Diagnosis present

## 2023-12-25 DIAGNOSIS — O2600 Excessive weight gain in pregnancy, unspecified trimester: Secondary | ICD-10-CM | POA: Diagnosis present

## 2023-12-25 DIAGNOSIS — O26893 Other specified pregnancy related conditions, third trimester: Secondary | ICD-10-CM | POA: Diagnosis present

## 2023-12-25 DIAGNOSIS — D563 Thalassemia minor: Secondary | ICD-10-CM | POA: Diagnosis present

## 2023-12-25 DIAGNOSIS — O365930 Maternal care for other known or suspected poor fetal growth, third trimester, not applicable or unspecified: Secondary | ICD-10-CM | POA: Diagnosis not present

## 2023-12-25 DIAGNOSIS — O99824 Streptococcus B carrier state complicating childbirth: Secondary | ICD-10-CM | POA: Diagnosis present

## 2023-12-25 DIAGNOSIS — Z3A38 38 weeks gestation of pregnancy: Secondary | ICD-10-CM | POA: Diagnosis not present

## 2023-12-25 DIAGNOSIS — O365990 Maternal care for other known or suspected poor fetal growth, unspecified trimester, not applicable or unspecified: Principal | ICD-10-CM | POA: Diagnosis present

## 2023-12-25 DIAGNOSIS — O99820 Streptococcus B carrier state complicating pregnancy: Secondary | ICD-10-CM | POA: Diagnosis not present

## 2023-12-25 DIAGNOSIS — Z98891 History of uterine scar from previous surgery: Secondary | ICD-10-CM

## 2023-12-25 LAB — COMPREHENSIVE METABOLIC PANEL WITH GFR
ALT: 18 U/L (ref 0–44)
AST: 20 U/L (ref 15–41)
Albumin: 2.5 g/dL — ABNORMAL LOW (ref 3.5–5.0)
Alkaline Phosphatase: 115 U/L (ref 38–126)
Anion gap: 6 (ref 5–15)
BUN: 6 mg/dL (ref 6–20)
CO2: 21 mmol/L — ABNORMAL LOW (ref 22–32)
Calcium: 8.4 mg/dL — ABNORMAL LOW (ref 8.9–10.3)
Chloride: 108 mmol/L (ref 98–111)
Creatinine, Ser: 0.59 mg/dL (ref 0.44–1.00)
GFR, Estimated: >60 mL/min (ref >60)
Glucose, Bld: 109 mg/dL — ABNORMAL HIGH (ref 70–99)
Potassium: 3.8 mmol/L (ref 3.5–5.1)
Sodium: 135 mmol/L (ref 135–145)
Total Bilirubin: 0.6 mg/dL (ref 0.0–1.2)
Total Protein: 6 g/dL — ABNORMAL LOW (ref 6.5–8.1)

## 2023-12-25 LAB — CBC
HCT: 38.2 % (ref 36.0–46.0)
HCT: 37.6 % (ref 36.0–46.0)
HCT: 40.2 % (ref 36.0–46.0)
Hemoglobin: 12.5 g/dL (ref 12.0–15.0)
Hemoglobin: 12 g/dL (ref 12.0–15.0)
Hemoglobin: 12.8 g/dL (ref 12.0–15.0)
MCH: 26.7 pg (ref 26.0–34.0)
MCH: 25.8 pg — ABNORMAL LOW (ref 26.0–34.0)
MCH: 26.1 pg (ref 26.0–34.0)
MCHC: 32.7 g/dL (ref 30.0–36.0)
MCHC: 31.9 g/dL (ref 30.0–36.0)
MCHC: 31.8 g/dL (ref 30.0–36.0)
MCV: 81.4 fL (ref 80.0–100.0)
MCV: 80.7 fL (ref 80.0–100.0)
MCV: 81.9 fL (ref 80.0–100.0)
Platelets: 116 10*3/uL — ABNORMAL LOW (ref 150–400)
Platelets: 112 10*3/uL — ABNORMAL LOW (ref 150–400)
Platelets: 107 10*3/uL — ABNORMAL LOW (ref 150–400)
RBC: 4.69 MIL/uL (ref 3.87–5.11)
RBC: 4.66 MIL/uL (ref 3.87–5.11)
RBC: 4.91 MIL/uL (ref 3.87–5.11)
RDW: 15.5 % (ref 11.5–15.5)
RDW: 15.3 % (ref 11.5–15.5)
RDW: 15.6 % — ABNORMAL HIGH (ref 11.5–15.5)
WBC: 7.6 10*3/uL (ref 4.0–10.5)
WBC: 7.3 10*3/uL (ref 4.0–10.5)
WBC: 9.6 10*3/uL (ref 4.0–10.5)
nRBC: 0 % (ref 0.0–0.2)
nRBC: 0 % (ref 0.0–0.2)
nRBC: 0 % (ref 0.0–0.2)

## 2023-12-25 LAB — TYPE AND SCREEN
ABO/RH(D): B POS
Antibody Screen: NEGATIVE

## 2023-12-25 LAB — RPR: RPR Ser Ql: NONREACTIVE

## 2023-12-25 LAB — PROTEIN / CREATININE RATIO, URINE
Creatinine, Urine: 33 mg/dL
Total Protein, Urine: <6 mg/dL

## 2023-12-25 SURGERY — Surgical Case
Anesthesia: Epidural

## 2023-12-25 MED ORDER — SODIUM CHLORIDE 0.9 % IV SOLN
5.0000 10*6.[IU] | Freq: Once | INTRAVENOUS | Status: AC
  Administered 2023-12-25: 5 10*6.[IU] via INTRAVENOUS
  Filled 2023-12-25: qty 5

## 2023-12-25 MED ORDER — SOD CITRATE-CITRIC ACID 500-334 MG/5ML PO SOLN
30.0000 mL | ORAL | Status: DC | PRN
  Administered 2023-12-25: 30 mL via ORAL
  Filled 2023-12-25: qty 30

## 2023-12-25 MED ORDER — PENICILLIN G POT IN DEXTROSE 60000 UNIT/ML IV SOLN
3.0000 10*6.[IU] | INTRAVENOUS | Status: DC
  Administered 2023-12-25 (×2): 3 10*6.[IU] via INTRAVENOUS
  Filled 2023-12-25 (×2): qty 50

## 2023-12-25 MED ORDER — PHENYLEPHRINE 80 MCG/ML (10ML) SYRINGE FOR IV PUSH (FOR BLOOD PRESSURE SUPPORT): 80.0000 ug | PREFILLED_SYRINGE | INTRAVENOUS | Status: DC | PRN

## 2023-12-25 MED ORDER — MORPHINE SULFATE (PF) 0.5 MG/ML IJ SOLN
INTRAMUSCULAR | Status: AC
  Filled 2023-12-25: qty 10

## 2023-12-25 MED ORDER — LACTATED RINGERS IV SOLN: 500.0000 mL | Freq: Once | INTRAVENOUS | Status: DC

## 2023-12-25 MED ORDER — OXYTOCIN-SODIUM CHLORIDE 30-0.9 UT/500ML-% IV SOLN: 2.5000 [IU]/h | INTRAVENOUS | Status: DC

## 2023-12-25 MED ORDER — ACETAMINOPHEN 325 MG PO TABS: 650.0000 mg | ORAL_TABLET | ORAL | Status: DC | PRN

## 2023-12-25 MED ORDER — FENTANYL CITRATE (PF) 100 MCG/2ML IJ SOLN
100.0000 ug | INTRAMUSCULAR | Status: DC | PRN
  Administered 2023-12-25 (×2): 100 ug via INTRAVENOUS
  Filled 2023-12-25 (×2): qty 2

## 2023-12-25 MED ORDER — EPHEDRINE 5 MG/ML INJ: 10.0000 mg | INTRAVENOUS | Status: DC | PRN

## 2023-12-25 MED ORDER — DIPHENHYDRAMINE HCL 50 MG/ML IJ SOLN: 12.5000 mg | INTRAMUSCULAR | Status: DC | PRN

## 2023-12-25 MED ORDER — OXYTOCIN-SODIUM CHLORIDE 30-0.9 UT/500ML-% IV SOLN
1.0000 m[IU]/min | INTRAVENOUS | Status: DC
  Administered 2023-12-25: 2 m[IU]/min via INTRAVENOUS
  Filled 2023-12-25: qty 500

## 2023-12-25 MED ORDER — CEFAZOLIN SODIUM-DEXTROSE 2-4 GM/100ML-% IV SOLN
2.0000 g | INTRAVENOUS | Status: AC
  Administered 2023-12-25: 2 g via INTRAVENOUS

## 2023-12-25 MED ORDER — PHENYLEPHRINE 80 MCG/ML (10ML) SYRINGE FOR IV PUSH (FOR BLOOD PRESSURE SUPPORT)
80.0000 ug | PREFILLED_SYRINGE | INTRAVENOUS | Status: DC | PRN
  Filled 2023-12-25: qty 10

## 2023-12-25 MED ORDER — ONDANSETRON HCL 4 MG/2ML IJ SOLN: 4.0000 mg | Freq: Four times a day (QID) | INTRAMUSCULAR | Status: DC | PRN

## 2023-12-25 MED ORDER — STERILE WATER FOR IRRIGATION IR SOLN
Status: DC | PRN
  Administered 2023-12-25: 1000 mL

## 2023-12-25 MED ORDER — OXYTOCIN BOLUS FROM INFUSION: 333.0000 mL | Freq: Once | INTRAVENOUS | Status: DC

## 2023-12-25 MED ORDER — FENTANYL-BUPIVACAINE-NACL 0.5-0.125-0.9 MG/250ML-% EP SOLN
12.0000 mL/h | EPIDURAL | Status: DC | PRN
  Administered 2023-12-25: 12 mL/h via EPIDURAL
  Filled 2023-12-25: qty 250

## 2023-12-25 MED ORDER — FENTANYL CITRATE (PF) 100 MCG/2ML IJ SOLN
INTRAMUSCULAR | Status: DC | PRN
  Administered 2023-12-25: 100 ug via EPIDURAL

## 2023-12-25 MED ORDER — TERBUTALINE SULFATE 1 MG/ML IJ SOLN
0.2500 mg | Freq: Once | INTRAMUSCULAR | Status: AC | PRN
  Administered 2023-12-25: 0.25 mg via SUBCUTANEOUS

## 2023-12-25 MED ORDER — LACTATED RINGERS IV SOLN: INTRAVENOUS | Status: DC

## 2023-12-25 MED ORDER — LACTATED RINGERS IV SOLN: 500.0000 mL | INTRAVENOUS | Status: DC | PRN

## 2023-12-25 MED ORDER — FENTANYL CITRATE (PF) 100 MCG/2ML IJ SOLN
INTRAMUSCULAR | Status: AC
  Filled 2023-12-25: qty 2

## 2023-12-25 MED ORDER — SODIUM CHLORIDE 0.9 % IV SOLN
500.0000 mg | INTRAVENOUS | Status: AC
  Administered 2023-12-25: 250 mg via INTRAVENOUS

## 2023-12-25 MED ORDER — ONDANSETRON HCL 4 MG/2ML IJ SOLN
INTRAMUSCULAR | Status: DC | PRN
  Administered 2023-12-25: 4 mg via INTRAVENOUS

## 2023-12-25 MED ORDER — LIDOCAINE HCL (PF) 1 % IJ SOLN
INTRAMUSCULAR | Status: DC | PRN
  Administered 2023-12-25: 8 mL via EPIDURAL

## 2023-12-25 MED ORDER — OXYCODONE-ACETAMINOPHEN 5-325 MG PO TABS: 2.0000 | ORAL_TABLET | ORAL | Status: DC | PRN

## 2023-12-25 MED ORDER — FENTANYL-BUPIVACAINE-NACL 0.5-0.125-0.9 MG/250ML-% EP SOLN: 12.0000 mL/h | EPIDURAL | Status: DC | PRN

## 2023-12-25 MED ORDER — PHENYLEPHRINE 80 MCG/ML (10ML) SYRINGE FOR IV PUSH (FOR BLOOD PRESSURE SUPPORT)
PREFILLED_SYRINGE | INTRAVENOUS | Status: DC | PRN
  Administered 2023-12-25: 160 ug via INTRAVENOUS
  Administered 2023-12-25 (×2): 80 ug via INTRAVENOUS
  Administered 2023-12-25 (×2): 160 ug via INTRAVENOUS
  Administered 2023-12-25: 80 ug via INTRAVENOUS
  Administered 2023-12-26 (×7): 160 ug via INTRAVENOUS

## 2023-12-25 MED ORDER — LIDOCAINE HCL (PF) 1 % IJ SOLN: 30.0000 mL | INTRAMUSCULAR | Status: DC | PRN

## 2023-12-25 MED ORDER — OXYCODONE-ACETAMINOPHEN 5-325 MG PO TABS: 1.0000 | ORAL_TABLET | ORAL | Status: DC | PRN

## 2023-12-25 MED ORDER — SOD CITRATE-CITRIC ACID 500-334 MG/5ML PO SOLN: 30.0000 mL | ORAL | Status: DC

## 2023-12-25 MED ORDER — LIDOCAINE-EPINEPHRINE (PF) 2 %-1:200000 IJ SOLN
INTRAMUSCULAR | Status: DC | PRN
  Administered 2023-12-25: 2 mL via EPIDURAL
  Administered 2023-12-25 (×2): 5 mL via EPIDURAL

## 2023-12-25 MED ORDER — TERBUTALINE SULFATE 1 MG/ML IJ SOLN
0.2500 mg | Freq: Once | INTRAMUSCULAR | Status: DC | PRN
  Filled 2023-12-25: qty 1

## 2023-12-25 SURGICAL SUPPLY — 35 items
BENZOIN TINCTURE PRP APPL 2/3 (GAUZE/BANDAGES/DRESSINGS) IMPLANT
CHLORAPREP W/TINT 26 (MISCELLANEOUS) ×2 IMPLANT
CLAMP UMBILICAL CORD (MISCELLANEOUS) ×1 IMPLANT
CLOTH BEACON ORANGE TIMEOUT ST (SAFETY) ×1 IMPLANT
DERMABOND ADVANCED .7 DNX12 (GAUZE/BANDAGES/DRESSINGS) ×2 IMPLANT
DRSG OPSITE POSTOP 4X10 (GAUZE/BANDAGES/DRESSINGS) ×1 IMPLANT
ELECTRODE REM PT RTRN 9FT ADLT (ELECTROSURGICAL) ×1 IMPLANT
EXTRACTOR VACUUM KIWI (MISCELLANEOUS) ×1 IMPLANT
GAUZE PAD ABD 7.5X8 STRL (GAUZE/BANDAGES/DRESSINGS) IMPLANT
GAUZE SPONGE 4X4 12PLY STRL LF (GAUZE/BANDAGES/DRESSINGS) IMPLANT
GLOVE BIOGEL PI IND STRL 7.0 (GLOVE) ×2 IMPLANT
GLOVE ECLIPSE 7.0 STRL STRAW (GLOVE) ×1 IMPLANT
GLOVE SURG SS PI 7.0 STRL IVOR (GLOVE) ×1 IMPLANT
GOWN STRL REUS W/TWL LRG LVL3 (GOWN DISPOSABLE) ×3 IMPLANT
KIT ABG SYR 3ML LUER SLIP (SYRINGE) IMPLANT
MAT PREVALON FULL STRYKER (MISCELLANEOUS) IMPLANT
NDL HYPO 25X5/8 SAFETYGLIDE (NEEDLE) IMPLANT
NEEDLE HYPO 25X5/8 SAFETYGLIDE (NEEDLE) IMPLANT
NS IRRIG 1000ML POUR BTL (IV SOLUTION) ×1 IMPLANT
PACK C SECTION WH (CUSTOM PROCEDURE TRAY) ×1 IMPLANT
PAD OB MATERNITY 4.3X12.25 (PERSONAL CARE ITEMS) ×1 IMPLANT
RETRACTOR TRAXI PANNICULUS (MISCELLANEOUS) IMPLANT
RTRCTR C-SECT PINK 25CM LRG (MISCELLANEOUS) ×1 IMPLANT
STRIP CLOSURE SKIN 1/2X4 (GAUZE/BANDAGES/DRESSINGS) IMPLANT
SUT MNCRL 0 VIOLET CTX 36 (SUTURE) ×2 IMPLANT
SUT MNCRL AB 3-0 PS2 27 (SUTURE) ×1 IMPLANT
SUT MNCRL AB 4-0 PS2 18 (SUTURE) ×1 IMPLANT
SUT VIC AB 0 CT1 27XBRD ANBCTR (SUTURE) ×1 IMPLANT
SUT VIC AB 0 CTX36XBRD ANBCTRL (SUTURE) ×1 IMPLANT
SUT VIC AB 2-0 CT1 TAPERPNT 27 (SUTURE) ×1 IMPLANT
SUT VIC AB 4-0 PS2 27 (SUTURE) ×1 IMPLANT
SUTURE PLAIN GUT 2.0 ETHICON (SUTURE) IMPLANT
TOWEL OR 17X24 6PK STRL BLUE (TOWEL DISPOSABLE) ×1 IMPLANT
TRAY FOLEY W/BAG SLVR 14FR LF (SET/KITS/TRAYS/PACK) ×1 IMPLANT
WATER STERILE IRR 1000ML POUR (IV SOLUTION) ×1 IMPLANT

## 2023-12-25 NOTE — Progress Notes (Signed)
 Labor Progress Note Grace Harper is a 28 y.o. [phone] at [redacted]w[redacted]d presented for TOLAC/IOL d/t previous failure to progress S: Patient is feeling contractions currently, but is mobile and feels her pain is manageable  O:  BP 129/70   Pulse 87   Temp 97.6 F (36.4 C) (Oral)   Resp 16   Ht 5\' 2"  (1.575 m)   Wt 105.7 kg   LMP 03/23/2023   BMI 42.62 kg/m  EFM: 145/moderate variability/reactive accels  CVE: Dilation: Fingertip Effacement (%): Thick Station: -2 Presentation: Vertex Exam by:: Grace Hayward, RN   A&P: 28 y.o. [id number] [redacted]w[redacted]d for TOLAC #Labor: Progressing well. Cook balloon in place #Pain: on demand #FWB: Cat I #GBS positive  Rayma Calandra, DO Center for [employer], [facility] 9:15 AM

## 2023-12-25 NOTE — Progress Notes (Signed)
 Pt asked to speak to provider after prolonged deceleration episode detailed in Dr. Reine Caraway prior note.  She is understandably shaken up a bit and significantly concerned about repeat decelerations and potential for emergent Csection.  I reassured her that baby looks reactive and reassuring at this movement and that we believe the prior episode was from prior treatable causes including hypotension.  I did tell patient that there was no way to predict if another deceleration would occur or if it would be reversible.  Pt states she would like to have the csection and no longer TOLAC given inability to predict further complications.  She understands that at this time it would be elective.    The risks of surgery were discussed with the patient including but were not limited to: bleeding which may require transfusion or reoperation; infection which may require antibiotics; injury to bowel, bladder, ureters or other surrounding organs; injury to the fetus; need for additional procedures including hysterectomy in the event of a life-threatening hemorrhage; formation of adhesions; placental abnormalities with subsequent pregnancies; incisional problems; thromboembolic phenomenon and other postoperative/anesthesia complications.    The patient concurred with the proposed plan, giving informed written consent for the procedure.   Patient has been NPO since 1300 and she will remain NPO for procedure. Anesthesia and OR aware. Preoperative prophylactic antibiotics and SCDs ordered on call to the OR.  To OR when ready.  Candice Chalet, MD Family Medicine - Obstetrics Fellow

## 2023-12-25 NOTE — Anesthesia Preprocedure Evaluation (Addendum)
 Anesthesia Evaluation  Patient identified by MRN, date of birth, ID band Patient awake    Reviewed: Allergy & Precautions, H&P , NPO status , Patient's Chart, lab work & pertinent test results, reviewed documented beta blocker date and time   Airway Mallampati: II  TM Distance: >3 FB Neck ROM: full    Dental no notable dental hx. (+) Teeth Intact, Dental Advisory Given   Pulmonary neg pulmonary ROS   Pulmonary exam normal breath sounds clear to auscultation       Cardiovascular negative cardio ROS Normal cardiovascular exam Rhythm:regular Rate:Normal     Neuro/Psych  Headaches  negative psych ROS   GI/Hepatic negative GI ROS, Neg liver ROS,,,  Endo/Other    Class 3 obesity  Renal/GU negative Renal ROS  negative genitourinary   Musculoskeletal   Abdominal   Peds  Hematology negative hematology ROS (+)   Anesthesia Other Findings   Reproductive/Obstetrics (+) Pregnancy                             Anesthesia Physical Anesthesia Plan  ASA: 3  Anesthesia Plan: Epidural   Post-op Pain Management: Minimal or no pain anticipated   Induction: Intravenous  PONV Risk Score and Plan: 2 and Treatment may vary due to age or medical condition  Airway Management Planned: Natural Airway  Additional Equipment: Fetal Monitoring  Intra-op Plan:   Post-operative Plan:   Informed Consent: I have reviewed the patients History and Physical, chart, labs and discussed the procedure including the risks, benefits and alternatives for the proposed anesthesia with the patient or authorized representative who has indicated his/her understanding and acceptance.     Dental Advisory Given  Plan Discussed with: Anesthesiologist  Anesthesia Plan Comments: (Labs checked- platelets confirmed with RN in room. Fetal heart tracing, per RN, reported to be stable enough for sitting procedure. Discussed epidural,  and patient consents to the procedure:  included risk of possible headache,backache, failed block, allergic reaction, and nerve injury. This patient was asked if she had any questions or concerns before the procedure started.)       Anesthesia Quick Evaluation

## 2023-12-25 NOTE — Progress Notes (Signed)
 Event Note  Called to patient room for prolonged deceleration. She is a [birth year] at [redacted]w[redacted]d a/f TOLAC/resolved FGR.  FHR was previously 135bpm, moderate variability, +accels; she then had 2 variables into a decel. I was called at that time; FHR noted to be coming back to baseline.   On arrival to room, FHR had again dipped to 90s. Pitocin  had been turned off, pt repositioned, and bolus running. Dr. Scherrie Curt at bedside as well. FSE was being attempted to be placed. RN felt fundus was firm c/w contraction so terbutaline  was given. FSE was placed and FHR had returned to 150s. SCE 4cm. Additional variable decel noted, patient repositioned.   FHR are now 140bpm with moderate variability and no deceleration noted x 10 minutes. Accels present. Some artifact from FSE, will troubleshoot to get better tracing.   Reviewed with patient that I suspect this was related to tachysystole. She is comfortable with normal abdominal contour, no tenderness - does not have evidence of uterine rupture at this time. Discussed with the patient that our goal is a vaginal delivery if safe for mom and baby, but if additional prolonged decelerations occur I will recommend moving forward with repeat cesarean delivery. She expressed her understanding.  Loralyn Rochester, MD Obstetrician & Gynecologist, [facility] for [employer], [facility]

## 2023-12-25 NOTE — Progress Notes (Signed)
 Patient ID: KEYMANI BROCKSCHMIDT, female   DOB: 1995-11-08, 28 y.o.   MRN: [id number]  BP (!) 135/57   Pulse 90   Temp 97.6 F (36.4 C) (Oral)   Resp 16   Ht 5\' 2"  (1.575 m)   Wt 233 lb (105.7 kg)   LMP 03/23/2023   SpO2 100%   BMI 42.62 kg/m    Dilation: 3.5 Effacement (%): 80 Cervical Position: Anterior Station: -1 Presentation: Vertex Exam by:: lee  Pt comfortable with epidural, Cat 1 tracing. RN noted bulging bag with well applied head on last exam (post-epidural). Ida Mains, SNM successfully performed AROM with clear return of fluid and bloody show. Pt tolerated procedure well. Will begin position changes shortly.  Lemuel Quaker, CNM, MSN, IBCLC Certified Nurse Midwife, [facility]

## 2023-12-25 NOTE — Anesthesia Procedure Notes (Signed)
 Epidural Patient location during procedure: OB Start time: 12/25/2023 6:36 PM End time: 12/25/2023 7:03 PM  Staffing Anesthesiologist: Rhenda Cedars, MD  Preanesthetic Checklist Completed: patient identified, IV checked, site marked, risks and benefits discussed, surgical consent, monitors and equipment checked, pre-op evaluation and timeout performed  Epidural Patient position: sitting Prep: DuraPrep and site prepped and draped Patient monitoring: continuous pulse ox and blood pressure Approach: midline Location: L4-L5 Injection technique: LOR air  Needle:  Needle type: Tuohy  Needle gauge: 17 G Needle length: 9 cm and 9 Needle insertion depth: 7 cm Catheter type: closed end flexible Catheter size: 19 Gauge Catheter at skin depth: 13 cm Test dose: negative  Assessment Events: blood not aspirated, no cerebrospinal fluid, injection not painful, no injection resistance, no paresthesia and negative IV test

## 2023-12-26 ENCOUNTER — Encounter (HOSPITAL_COMMUNITY): Payer: Self-pay | Admitting: Family Medicine

## 2023-12-26 ENCOUNTER — Other Ambulatory Visit: Payer: Self-pay

## 2023-12-26 DIAGNOSIS — O365930 Maternal care for other known or suspected poor fetal growth, third trimester, not applicable or unspecified: Secondary | ICD-10-CM | POA: Diagnosis not present

## 2023-12-26 DIAGNOSIS — O34211 Maternal care for low transverse scar from previous cesarean delivery: Secondary | ICD-10-CM

## 2023-12-26 DIAGNOSIS — O99820 Streptococcus B carrier state complicating pregnancy: Secondary | ICD-10-CM | POA: Diagnosis not present

## 2023-12-26 DIAGNOSIS — Z3A38 38 weeks gestation of pregnancy: Secondary | ICD-10-CM

## 2023-12-26 DIAGNOSIS — Z3A39 39 weeks gestation of pregnancy: Secondary | ICD-10-CM | POA: Diagnosis not present

## 2023-12-26 DIAGNOSIS — O99214 Obesity complicating childbirth: Secondary | ICD-10-CM | POA: Diagnosis not present

## 2023-12-26 LAB — CBC
HCT: 35.3 % — ABNORMAL LOW (ref 36.0–46.0)
Hemoglobin: 11.7 g/dL — ABNORMAL LOW (ref 12.0–15.0)
MCH: 26.9 pg (ref 26.0–34.0)
MCHC: 33.1 g/dL (ref 30.0–36.0)
MCV: 81.1 fL (ref 80.0–100.0)
Platelets: 114 10*3/uL — ABNORMAL LOW (ref 150–400)
RBC: 4.35 MIL/uL (ref 3.87–5.11)
RDW: 15.4 % (ref 11.5–15.5)
WBC: 14.8 10*3/uL — ABNORMAL HIGH (ref 4.0–10.5)
nRBC: 0 % (ref 0.0–0.2)

## 2023-12-26 MED ORDER — ZOLPIDEM TARTRATE 5 MG PO TABS
5.0000 mg | ORAL_TABLET | Freq: Every evening | ORAL | Status: DC | PRN
  Administered 2023-12-26: 5 mg via ORAL

## 2023-12-26 MED ORDER — SENNOSIDES-DOCUSATE SODIUM 8.6-50 MG PO TABS
2.0000 | ORAL_TABLET | Freq: Every day | ORAL | Status: DC
  Administered 2023-12-27 – 2023-12-28 (×2): 2 via ORAL
  Filled 2023-12-26 (×2): qty 2

## 2023-12-26 MED ORDER — WITCH HAZEL-GLYCERIN EX PADS: 1.0000 | MEDICATED_PAD | CUTANEOUS | Status: DC | PRN

## 2023-12-26 MED ORDER — IBUPROFEN 600 MG PO TABS
600.0000 mg | ORAL_TABLET | Freq: Four times a day (QID) | ORAL | Status: DC
  Administered 2023-12-27 – 2023-12-28 (×5): 600 mg via ORAL
  Filled 2023-12-26 (×5): qty 1

## 2023-12-26 MED ORDER — ACETAMINOPHEN 500 MG PO TABS
1000.0000 mg | ORAL_TABLET | Freq: Four times a day (QID) | ORAL | Status: DC
  Administered 2023-12-26 – 2023-12-28 (×9): 1000 mg via ORAL
  Filled 2023-12-26 (×9): qty 2

## 2023-12-26 MED ORDER — EPHEDRINE SULFATE-NACL 50-0.9 MG/10ML-% IV SOSY
PREFILLED_SYRINGE | INTRAVENOUS | Status: DC | PRN
  Administered 2023-12-25: 2.5 mg via INTRAVENOUS
  Administered 2023-12-26: 5 mg via INTRAVENOUS
  Administered 2023-12-26: 2.5 mg via INTRAVENOUS
  Administered 2023-12-26 (×2): 10 mg via INTRAVENOUS

## 2023-12-26 MED ORDER — OXYTOCIN-SODIUM CHLORIDE 30-0.9 UT/500ML-% IV SOLN
2.5000 [IU]/h | INTRAVENOUS | Status: AC
  Administered 2023-12-26: 2.5 [IU]/h via INTRAVENOUS
  Filled 2023-12-26: qty 500

## 2023-12-26 MED ORDER — HYDROMORPHONE HCL 1 MG/ML IJ SOLN: 0.2000 mg | INTRAMUSCULAR | Status: DC | PRN

## 2023-12-26 MED ORDER — OXYCODONE HCL 5 MG PO TABS
5.0000 mg | ORAL_TABLET | ORAL | Status: DC | PRN
  Administered 2023-12-27: 5 mg via ORAL
  Administered 2023-12-27: 10 mg via ORAL
  Administered 2023-12-27: 5 mg via ORAL
  Administered 2023-12-28: 10 mg via ORAL
  Filled 2023-12-26 (×2): qty 1
  Filled 2023-12-26: qty 2
  Filled 2023-12-26 (×2): qty 1

## 2023-12-26 MED ORDER — DIPHENHYDRAMINE HCL 25 MG PO CAPS: 25.0000 mg | ORAL_CAPSULE | Freq: Four times a day (QID) | ORAL | Status: DC | PRN

## 2023-12-26 MED ORDER — DIBUCAINE (PERIANAL) 1 % EX OINT: 1.0000 | TOPICAL_OINTMENT | CUTANEOUS | Status: DC | PRN

## 2023-12-26 MED ORDER — SIMETHICONE 80 MG PO CHEW
80.0000 mg | CHEWABLE_TABLET | Freq: Three times a day (TID) | ORAL | Status: DC
  Administered 2023-12-26 – 2023-12-28 (×5): 80 mg via ORAL
  Filled 2023-12-26 (×6): qty 1

## 2023-12-26 MED ORDER — SCOPOLAMINE 1 MG/3DAYS TD PT72
MEDICATED_PATCH | TRANSDERMAL | Status: AC
  Filled 2023-12-26: qty 1

## 2023-12-26 MED ORDER — ONDANSETRON 4 MG PO TBDP
4.0000 mg | ORAL_TABLET | Freq: Once | ORAL | Status: AC
  Administered 2023-12-26: 4 mg via ORAL
  Filled 2023-12-26: qty 1

## 2023-12-26 MED ORDER — ACETAMINOPHEN 10 MG/ML IV SOLN
INTRAVENOUS | Status: DC | PRN
  Administered 2023-12-26: 1000 mg via INTRAVENOUS

## 2023-12-26 MED ORDER — METOCLOPRAMIDE HCL 5 MG/ML IJ SOLN
INTRAMUSCULAR | Status: DC | PRN
  Administered 2023-12-26: 10 mg via INTRAVENOUS

## 2023-12-26 MED ORDER — KETOROLAC TROMETHAMINE 30 MG/ML IJ SOLN
30.0000 mg | Freq: Four times a day (QID) | INTRAMUSCULAR | Status: AC
  Administered 2023-12-26 – 2023-12-27 (×4): 30 mg via INTRAVENOUS
  Filled 2023-12-26 (×4): qty 1

## 2023-12-26 MED ORDER — PRENATAL MULTIVITAMIN CH
1.0000 | ORAL_TABLET | Freq: Every day | ORAL | Status: DC
Start: 2023-12-26 — End: 2023-12-28
  Administered 2023-12-26 – 2023-12-28 (×3): 1 via ORAL
  Filled 2023-12-26 (×3): qty 1

## 2023-12-26 MED ORDER — OXYTOCIN-SODIUM CHLORIDE 30-0.9 UT/500ML-% IV SOLN
INTRAVENOUS | Status: DC | PRN
  Administered 2023-12-26: 300 mL via INTRAVENOUS

## 2023-12-26 MED ORDER — SODIUM CHLORIDE 0.9 % IR SOLN
Status: DC | PRN
  Administered 2023-12-26: 1

## 2023-12-26 MED ORDER — MENTHOL 3 MG MT LOZG: 1.0000 | LOZENGE | OROMUCOSAL | Status: DC | PRN

## 2023-12-26 MED ORDER — COCONUT OIL OIL
1.0000 | TOPICAL_OIL | Status: DC | PRN
  Administered 2023-12-28: 1 via TOPICAL

## 2023-12-26 MED ORDER — SCOPOLAMINE 1 MG/3DAYS TD PT72
1.0000 | MEDICATED_PATCH | TRANSDERMAL | Status: DC
  Administered 2023-12-26: 1.5 mg via TRANSDERMAL

## 2023-12-26 MED ORDER — SIMETHICONE 80 MG PO CHEW: 80.0000 mg | CHEWABLE_TABLET | ORAL | Status: DC | PRN

## 2023-12-26 MED ORDER — DEXAMETHASONE SODIUM PHOSPHATE 10 MG/ML IJ SOLN
INTRAMUSCULAR | Status: DC | PRN
  Administered 2023-12-26: 10 mg via INTRAVENOUS

## 2023-12-26 MED ORDER — MORPHINE SULFATE (PF) 0.5 MG/ML IJ SOLN
INTRAMUSCULAR | Status: DC | PRN
  Administered 2023-12-26: 3 mg via EPIDURAL

## 2023-12-26 NOTE — Discharge Summary (Signed)
 Postpartum Discharge Summary  Date of Service updated***     Patient Name: Grace Harper DOB: 1995-12-23 MRN: [id number]  Date of admission: 12/25/2023 Delivery date:12/26/2023 Delivering provider: Izell Marsh Date of discharge: 12/26/2023  Admitting diagnosis: Fetal growth restriction antepartum [O36.5990] Intrauterine pregnancy: [redacted]w[redacted]d     Secondary diagnosis:  Principal Problem:   Fetal growth restriction antepartum Active Problems:   H/O pre-eclampsia in prior pregnancy, currently pregnant   Hx of cesarean section  Additional problems: None    Discharge diagnosis: Term Pregnancy Delivered                                              Post partum procedures:{Postpartum procedures:23558} Augmentation: AROM, Pitocin , and IP Foley Complications: None  Hospital course: Induction of Labor With Cesarean Section   28 y.o. yo [id number] at [redacted]w[redacted]d was admitted to the hospital 12/25/2023 for induction of labor. Patient had a labor course significant for a prolonged FHR deceleration with return to baseline & cat I tracing. She then opted for repeat cesarean delivery. The patient went for cesarean section due to Elective Repeat. Delivery details are as follows: Membrane Rupture Time/Date: 7:08 PM,12/25/2023  Delivery Method:C-Section, Low Transverse Operative Delivery:N/A Details of operation can be found in separate operative Note.  Patient had a postpartum course complicated by***. She is ambulating, tolerating a regular diet, passing flatus, and urinating well.  Patient is discharged home in stable condition on 12/26/23.      Newborn Data: Birth date:12/26/2023 Birth time:12:03 AM Gender:Female Living status:Living Apgars:7 ,9  Weight:2890 g                               Magnesium  Sulfate received: {Mag received:30440022} BMZ received: No Rhophylac:N/A MMR:N/A T-DaP:{Tdap:23962} Flu: N/A RSV Vaccine received: No Transfusion:{Transfusion received:30440034}  Immunizations  received: Immunization History  Administered Date(s) Administered   DTaP 12/15/1995, 03/14/1996, 05/24/1996, 01/17/1997, 11/02/1999   H1N1 07/31/2008   HIB (PRP-OMP) 12/15/1995, 03/14/1996, 05/24/1996, 10/17/1996, 01/17/1997   HPV Quadrivalent 07/31/2008, 11/26/2008, 03/18/2010   Hepatitis A 05/23/2008, 11/26/2008   Hepatitis B 1996/08/22, 12/15/1995, 04/24/1996   IPV 12/15/1995, 03/14/1996, 10/17/1996, 11/02/1999   Influenza Split 05/23/2008   Influenza,inj,Quad PF,6+ Mos 05/11/2013   MMR 10/17/1996, 11/02/1999   Meningococcal Conjugate 11/26/2008   Pneumococcal Conjugate-13 11/02/1999   Td 06/13/2007   Tdap 06/13/2007, 05/11/2013   Varicella 01/17/1997, 05/23/2008    Physical exam  Vitals:   12/25/23 2225 12/25/23 2230 12/25/23 2305 12/25/23 2310  BP:      Pulse:      Resp:      Temp:      TempSrc:      SpO2: 99% 98% 100% 100%  Weight:      Height:       General: {Exam; general:21111117} Lochia: {Desc; appropriate/inappropriate:30686::"appropriate"} Uterine Fundus: {Desc; firm/soft:30687} Incision: {Exam; incision:21111123} DVT Evaluation: {Exam; dvt:2111122} Labs: Lab Results  Component Value Date   WBC 9.6 12/25/2023   HGB 12.8 12/25/2023   HCT 40.2 12/25/2023   MCV 81.9 12/25/2023   PLT 107 (L) 12/25/2023      Latest Ref Rng & Units 12/25/2023    5:22 AM  CMP  Glucose 70 - 99 mg/dL 213   BUN 6 - 20 mg/dL 6   Creatinine 0.86 - 5.78 mg/dL 4.69   Sodium  135 - 145 mmol/L 135   Potassium 3.5 - 5.1 mmol/L 3.8   Chloride 98 - 111 mmol/L 108   CO2 22 - 32 mmol/L 21   Calcium 8.9 - 10.3 mg/dL 8.4   Total Protein 6.5 - 8.1 g/dL 6.0   Total Bilirubin 0.0 - 1.2 mg/dL 0.6   Alkaline Phos 38 - 126 U/L 115   AST 15 - 41 U/L 20   ALT 0 - 44 U/L 18    Edinburgh Score:     No data to display         No data recorded  After visit meds:  Allergies as of 12/26/2023   No Known Allergies   Med Rec must be completed prior to using this  [facility]***        Discharge home in stable condition Infant Feeding:  Breast & formula Infant Disposition:{CHL IP OB HOME WITH WUJWJX:91478} Discharge instruction: per After Visit Summary and Postpartum booklet. Activity: Advance as tolerated. Pelvic rest for 6 weeks.  Diet: routine diet Future Appointments:No future appointments. Follow up Visit:  Message sent by [facility]  Please schedule this patient for a In person postpartum visit in 6 weeks with the following provider: Any provider. Additional Postpartum F/U:Incision check 1 week  High risk pregnancy complicated by:  BMI 42, FGR, hx preE, prior CS Delivery mode:  C-Section, Low Transverse Anticipated Birth Control:  OCPs   12/26/2023 Izell Marsh, MD

## 2023-12-26 NOTE — Transfer of Care (Signed)
 Immediate Anesthesia Transfer of Care Note  Patient: Grace Harper  Procedure(s) Performed: CESAREAN DELIVERY  Patient Location: PACU  Anesthesia Type:Epidural  Level of Consciousness: awake, alert , and oriented  Airway & Oxygen Therapy: Patient Spontanous Breathing  Post-op Assessment: Report given to RN, Post -op Vital signs reviewed and stable, and Patient moving all extremities  Post vital signs: Reviewed and stable  Last Vitals:  Vitals Value Taken Time  BP 117/78 12/26/23 0121  Temp    Pulse 104 12/26/23 0126  Resp 22 12/26/23 0126  SpO2 98 % 12/26/23 0126  Vitals shown include unfiled device data.  Last Pain:  Vitals:   12/25/23 2204  TempSrc:   PainSc: 0-No pain         Complications: No notable events documented.

## 2023-12-26 NOTE — Lactation Note (Signed)
 This note was copied from a baby's chart. Lactation Consultation Note  Patient Name: Grace Harper [id number] Date: 12/26/2023 Age:28 hours Reason for consult: Initial assessment;1st time breastfeeding;Term  P2- MOB reports that infant has been latching well since birth, but now she is really sleepy. LC reviewed the first 24 hr birthday nap and how this is normal behavior. MOB stated that she plans to offer both breast milk and formula because she does not know how she feels about exclusive breastfeeding at this time. LC reassured MOB that this is okay if that is what will work best for her and her family. LC reviewed the benefits of breast milk, Encouraged protecting the supply by offer the breast first then formula, and to pump/hand express breast if infant takes formula over the breast. MOB had already been set up with the hospital DEBP by her RN. MOB had just finished pumping the left breast only and collected 5 mL of EBM. LC praised MOB and encouraged her to offer infant the EBM for the next feeding. MOB denies having any questions or concerns at this time.  LC reviewed the first 24 hr birthday nap, day 2 cluster feeding, feeding infant on cue 8-12x in 24 hrs, not allowing infant to go over 3 hrs without a feeding, CDC milk storage guidelines, LC services handout and engorgement/breast care. LC encouraged MOB to call for further assistance as needed. [facility] sent [facility] referral out today.  Maternal Data Has patient been taught Hand Expression?: No Does the patient have breastfeeding experience prior to this delivery?: No (States that first child would not latch while in the hospital so she formula fed the entire time.)  Feeding Mother's Current Feeding Choice: Breast Milk and Formula  Lactation Tools Discussed/Used Tools: Pump;Flanges Flange Size: 18 Breast pump type: Double-Electric Breast Pump;Manual Pump Education: Setup, frequency, and cleaning;Milk Storage Reason for Pumping: MOB  request Pumping frequency: 15-20 min every 3 hrs Pumped volume: 5 mL (just from left breast)  Interventions Interventions: Breast feeding basics reviewed;Expressed milk;Hand pump;DEBP;Education;LC Services brochure  Discharge Discharge Education: Engorgement and breast care;Warning signs for feeding baby Pump: Hands Free;Personal;Referral sent for [facility] Pump  Consult Status Consult Status: Follow-up Date: 12/27/23 Follow-up type: In-patient    Vernette Goo BS, IBCLC 12/26/2023, 4:08 PM

## 2023-12-26 NOTE — Anesthesia Postprocedure Evaluation (Signed)
 Anesthesia Post Note  Patient: Grace Harper  Procedure(s) Performed: CESAREAN DELIVERY     Patient location during evaluation: Mother Baby Anesthesia Type: Epidural Level of consciousness: awake and alert Pain management: pain level controlled Vital Signs Assessment: post-procedure vital signs reviewed and stable Respiratory status: spontaneous breathing, nonlabored ventilation and respiratory function stable Cardiovascular status: stable Postop Assessment: no headache, no backache and epidural receding Anesthetic complications: no   No notable events documented.  Last Vitals:  Vitals:   12/26/23 0200 12/26/23 0201  BP: 122/73 122/73  Pulse: (!) 103 (!) 109  Resp: 17 18  Temp:    SpO2: 97% 98%    Last Pain:  Vitals:   12/26/23 0151  TempSrc:   PainSc: 0-No pain   Pain Goal:    LLE Motor Response: Purposeful movement (12/26/23 0152) LLE Sensation: Tingling (12/26/23 0152) RLE Motor Response: Purposeful movement (12/26/23 0152) RLE Sensation: Tingling (12/26/23 0152)     Epidural/Spinal Function Cutaneous sensation: Able to Wiggle Toes (12/26/23 0131), Patient able to flex knees: Yes (12/26/23 0131), Patient able to lift hips off bed: Yes (12/26/23 0131), Back pain beyond tenderness at insertion site: No (12/26/23 0131), Progressively worsening motor and/or sensory loss: No (12/26/23 0131), Bowel and/or bladder incontinence post epidural: No (12/26/23 0120)  Briyonna Omara

## 2023-12-26 NOTE — Op Note (Signed)
 Jema M Krenn PROCEDURE DATE: 12/26/2023  PREOPERATIVE DIAGNOSES: Intrauterine pregnancy at [redacted]w[redacted]d weeks gestation; Elective repeat CS   POSTOPERATIVE DIAGNOSES: The same  PROCEDURE: Repeat Low Transverse Cesarean Section  SURGEON:  Dr. Loralyn Rochester  ASSISTANT:  Dr. Candice Chalet  ANESTHESIOLOGY TEAM: Anesthesiologist: Rhenda Cedars, MD CRNA: Bernell Brigham, CRNA  INDICATIONS: AZERIA ALEO is a 28 y.o. [phone] at [redacted]w[redacted]d here for cesarean section secondary to the indications listed under preoperative diagnoses. She was admitted for labor/TOLAC and progressed to 4cm. She was ruptured on pitocin . She had a prolonged FHR deceleration but recovered and subsequent tracing was category 1. She opted to proceed with repeat cesarean delivery and declined further attempt to [facility].   The risks of surgery were discussed with the patient including but were not limited to: bleeding which may require transfusion or reoperation; infection which may require antibiotics; injury to bowel, bladder, ureters or other surrounding organs; injury to the fetus; need for additional procedures including hysterectomy in the event of a life-threatening hemorrhage; formation of adhesions; placental abnormalities wth subsequent pregnancies; incisional problems; thromboembolic phenomenon and other postoperative/anesthesia complications.  The patient concurred with the proposed plan, giving informed written consent for the procedure.    FINDINGS:  Viable female infant in cephalic presentation.  Apgars 7 and 9.  Amniotic fluid: clear.  Intact placenta, three vessel cord.  Normal uterus, fallopian tubes and ovaries bilaterally. Dense adhesive disease between rectus, fascia requiring sharp dissection but minimal intraperitoneal adhesive disease.  ANESTHESIA: epidural INTRAVENOUS FLUIDS: 1800 ml   ESTIMATED BLOOD LOSS: 859 ml URINE OUTPUT:  150 ml SPECIMENS: Placenta sent to L&D  COMPLICATIONS: None  immediate  PROCEDURE IN DETAIL:  The patient preoperatively received intravenous antibiotics and had sequential compression devices applied to her lower extremities.  She was then taken to the operating room where the epidural was dosed up to a surgical level and found to be adequate. She was then placed in a dorsal supine position with a leftward tilt, and prepped and draped in a sterile manner.  A foley catheter was  was already in place.  After an adequate timeout was performed, a Pfannenstiel skin incision was made with scalpel and carried through to the underlying layer of fascia. The fascia was incised in the midline, and this incision was extended bluntly. Further extension was needed with Mayos. Sharp dissection was used to separate the rectus from the fascia. The rectus muscles were separated in the midline. The peritoneum was elevated and sharply entered. The Bovie was used to divide peritoneum and create adequate space for delivery of the fetus. An additional 1cm incision was made in the right rectus with the Bovie to facilitate placement of Alexis and delivery of the fetus.   The Alexis self-retaining retractor was introduced into the abdominal cavity.  Attention was turned to the lower uterine segment where a low transverse hysterotomy was made with a scalpel and extended bilaterally bluntly.  The infant was successfully delivered, the cord was clamped and cut after one minute, and the infant was handed over to the awaiting neonatology team. Uterine massage was then administered, and the placenta delivered intact with a three-vessel cord. The uterus was then cleared of clots and debris.  The hysterotomy was closed with 0 Monocryl in a running fashion.  2 figure-of-eight 0 Monocryl serosal stitches were placed at the lateral aspects of the incision to help with hemostasis  The pelvis was cleared of all clot and debris. Hemostasis was confirmed on all surfaces.  The retractor was removed.  The  peritoneum was closed with a 2-0 Vicryl running stitch. The fascia was then closed using 0 Vicryl in a running fashion.  The subcutaneous layer was irrigated, and was found to be hemostatic, any areas of bleeding were cauterized with the bovie, and was reapproximated with 2-0 plain gut in a running fashion. The skin was closed with a 4-0 vicryl subcuticular stitch. The patient tolerated the procedure well. Sponge, instrument and needle counts were correct x 3.  She was taken to the recovery room in stable condition.   Loralyn Rochester, MD Obstetrician & Gynecologist, [facility] for [employer], [facility]

## 2023-12-26 NOTE — Progress Notes (Deleted)
 Grace Harper PROCEDURE DATE: 12/26/2023  PREOPERATIVE DIAGNOSES: Intrauterine pregnancy at [redacted]w[redacted]d weeks gestation; Elective repeat CS   POSTOPERATIVE DIAGNOSES: The same  PROCEDURE: Repeat Low Transverse Cesarean Section  SURGEON:  Dr. Loralyn Rochester  ASSISTANT:  Dr. Candice Chalet  ANESTHESIOLOGY TEAM: Anesthesiologist: Rhenda Cedars, MD CRNA: Bernell Brigham, CRNA  INDICATIONS: Grace Harper is a 28 y.o. [phone] at [redacted]w[redacted]d here for cesarean section secondary to the indications listed under preoperative diagnoses. She was admitted for labor/TOLAC and progressed to 4cm. She was ruptured on pitocin . She had a prolonged FHR deceleration but recovered and subsequent tracing was category 1. She opted to proceed with repeat cesarean delivery and declined further attempt to [facility].   The risks of surgery were discussed with the patient including but were not limited to: bleeding which may require transfusion or reoperation; infection which may require antibiotics; injury to bowel, bladder, ureters or other surrounding organs; injury to the fetus; need for additional procedures including hysterectomy in the event of a life-threatening hemorrhage; formation of adhesions; placental abnormalities wth subsequent pregnancies; incisional problems; thromboembolic phenomenon and other postoperative/anesthesia complications.  The patient concurred with the proposed plan, giving informed written consent for the procedure.    FINDINGS:  Viable female infant in cephalic presentation.  Apgars 7 and 9.  Amniotic fluid: clear.  Intact placenta, three vessel cord.  Normal uterus, fallopian tubes and ovaries bilaterally. Dense adhesive disease between rectus, fascia requiring sharp dissection but minimal intraperitoneal adhesive disease.  ANESTHESIA: epidural INTRAVENOUS FLUIDS: 1800 ml   ESTIMATED BLOOD LOSS: 859 ml URINE OUTPUT:  150 ml SPECIMENS: Placenta sent to L&D  COMPLICATIONS: None  immediate  PROCEDURE IN DETAIL:  The patient preoperatively received intravenous antibiotics and had sequential compression devices applied to her lower extremities.  She was then taken to the operating room where the epidural was dosed up to a surgical level and found to be adequate. She was then placed in a dorsal supine position with a leftward tilt, and prepped and draped in a sterile manner.  A foley catheter was  was already in place.  After an adequate timeout was performed, a Pfannenstiel skin incision was made with scalpel and carried through to the underlying layer of fascia. The fascia was incised in the midline, and this incision was extended bluntly. Further extension was needed with Mayos. Sharp dissection was used to separate the rectus from the fascia. The rectus muscles were separated in the midline. The peritoneum was elevated and sharply entered. The Bovie was used to divide peritoneum and create adequate space for delivery of the fetus. An additional 1cm incision was made in the right rectus with the Bovie to facilitate placement of Alexis and delivery of the fetus.   The Alexis self-retaining retractor was introduced into the abdominal cavity.  Attention was turned to the lower uterine segment where a low transverse hysterotomy was made with a scalpel and extended bilaterally bluntly.  The infant was successfully delivered, the cord was clamped and cut after one minute, and the infant was handed over to the awaiting neonatology team. Uterine massage was then administered, and the placenta delivered intact with a three-vessel cord. The uterus was then cleared of clots and debris.  The hysterotomy was closed with 0 Monocryl in a running fashion.  2 figure-of-eight 0 Monocryl serosal stitches were placed at the lateral aspects of the incision to help with hemostasis  The pelvis was cleared of all clot and debris. Hemostasis was confirmed on all surfaces.  The retractor was removed.  The  peritoneum was closed with a 2-0 Vicryl running stitch. The fascia was then closed using 0 Vicryl in a running fashion.  The subcutaneous layer was irrigated, and was found to be hemostatic, any areas of bleeding were cauterized with the bovie, and was reapproximated with 2-0 plain gut in a running fashion. The skin was closed with a 4-0 vicryl subcuticular stitch. The patient tolerated the procedure well. Sponge, instrument and needle counts were correct x 3.  She was taken to the recovery room in stable condition.   Loralyn Rochester, MD Obstetrician & Gynecologist, [facility] for [employer], [facility]

## 2023-12-27 NOTE — Patient Instructions (Signed)
 Your appointment with Outpatient Lactation is: Date: May 14 th Time: 11:30 am Therapist, [facility] (First Floor) [address] [place]  Check in under baby's name.  Please bring your baby hungry along with your pump and a bottle of either formula or expressed breast milk. Please also bring your pump flanges and we welcome support people! If you need lactation assistance before your appointment, please call [phone] and press 4 for lactation.

## 2023-12-27 NOTE — Lactation Note (Signed)
 This note was copied from a baby's chart. Lactation Consultation Note  Patient Name: Grace Harper Today's Date: 12/27/2023 Age:28 hours  [employer] sees [employer]BS as moms primary insurance, even though expired in April. [employer] Medicaid is moms secondary insurance. Mom needs to call Medicaid, update them- they are now her official primary- get a reference number so [employer] can get her breast pump for her. Mom updated, written note left @ her bedside as a reminder of what's required.      Lache Dagher 12/27/2023, 6:27 PM

## 2023-12-27 NOTE — Progress Notes (Signed)
 POSTPARTUM PROGRESS NOTE  Subjective: Grace Harper is a 28 y.o. [id number] s/p rLTCS at [redacted]w[redacted]d.  She reports she is doing well. No acute events overnight. She denies any problems with ambulating, voiding or po intake. Denies nausea or vomiting. She has passed flatus. Pain is well controlled.  Lochia is moderate.  Objective: Blood pressure (!) 108/57, pulse 84, temperature 98.1 F (36.7 C), temperature source Oral, resp. rate 18, height 5\' 2"  (1.575 m), weight 105.7 kg, last menstrual period 03/23/2023, SpO2 99%, unknown if currently breastfeeding.  Physical Exam:  General: alert, cooperative and no distress Chest: no respiratory distress Abdomen: soft, non-tender  Uterine Fundus: firm and at level of umbilicus Extremities: No calf swelling or tenderness  No edema  Recent Labs    12/25/23 1728 12/26/23 0534  HGB 12.8 11.7*  HCT 40.2 35.3*    Assessment/Plan: Grace Harper is a 28 y.o. [id number] s/p rLTCS at [redacted]w[redacted]d for elective.  Routine Postpartum Care: Doing well, pain well-controlled.  -- Continue routine care, lactation support  -- Contraception: OCPs -- Feeding: both  Dispo: Plan for discharge 5/8-5/9.  Grace Sorenson, MD OB Fellow 12/27/2023 11:53 AM

## 2023-12-27 NOTE — Lactation Note (Signed)
 This note was copied from a baby's chart. Lactation Consultation Note  Patient Name: Girl Lachell Fantasia [id number] Date: 12/27/2023 Age:28 hours   [employer] working on getting mom breast pump through [employer], questioning policy still effective- desires mom to call Medicaid and get a reference number. Picture of insurance card and verification within our system that moms policy is active till 01/20/2024 scanned back to [employer].     Renne Cornick 12/27/2023, 1:16 PM

## 2023-12-28 ENCOUNTER — Other Ambulatory Visit (HOSPITAL_COMMUNITY): Payer: Self-pay

## 2023-12-28 MED ORDER — OXYCODONE HCL 5 MG PO TABS
5.0000 mg | ORAL_TABLET | ORAL | 0 refills | Status: DC | PRN
Start: 2023-12-28 — End: 2024-04-25
  Filled 2023-12-28: qty 30, 3d supply, fill #0

## 2023-12-28 MED ORDER — IBUPROFEN 600 MG PO TABS
600.0000 mg | ORAL_TABLET | Freq: Four times a day (QID) | ORAL | 0 refills | Status: DC
  Filled 2023-12-28: qty 30, 8d supply, fill #0

## 2023-12-28 MED ORDER — SENNOSIDES-DOCUSATE SODIUM 8.6-50 MG PO TABS
2.0000 | ORAL_TABLET | Freq: Every day | ORAL | 0 refills | Status: AC
Start: 2023-12-28 — End: ?
  Filled 2023-12-28: qty 30, 15d supply, fill #0

## 2023-12-28 MED ORDER — ACETAMINOPHEN 500 MG PO TABS
1000.0000 mg | ORAL_TABLET | Freq: Four times a day (QID) | ORAL | 0 refills | Status: DC
  Filled 2023-12-28: qty 30, 4d supply, fill #0

## 2023-12-28 NOTE — Lactation Note (Signed)
 This note was copied from a baby's chart. Lactation Consultation Note  Patient Name: Grace Harper [id number] Date: 12/28/2023 Age:28 hours Reason for consult: Follow-up assessment;Maternal discharge;Term   @1100   P2, 39 wks, @ 59 hrs of life. Mom shared with provider- sore nipples. Per provider mom has been trying to latch before bottle feeding. Discussed steps of latching, working on big mouth latch from baby. Mom didn't realize more then nipple feeding was important. Coconut oil and EBM encouraged to nipples post feed. Hydrogels also provided, instructed not to do both a oil/cream and the pads @ the same time.Revisited insurance issue from yesterday- mom ran into same issue with medications, called Medicaid- was made primary but did not get a reference number. Re-scanned request to [employer]. Encouraged mom to call with next latch so we can work together prior to discharge.  @ 1200  Family member feeding baby a bottle with LC arrival. Hand pump provided with variety of flanges, encouraged size dynamic, changes with breast changes. Encouraged mom to keep working on big mouth latch with baby and use EBM or coconut oil after each feed. Discussed cluster feeding overnight/ early morning brings in our milk supply, shared expectations of milk coming in. Highlighted risk of engorgement. Discussed hand pump/express to soften breasts, motrin  as anti-inflammatory, and ice packs for 10-20 minutes post feed/pumping if still over-full is the best treatments for inflamed/engorged breasts.    Feeding Mother's Current Feeding Choice: Breast Milk and Formula   Lactation Tools Discussed/Used Breast pump type: Manual Pump Education: Milk Storage  Interventions Interventions: Breast feeding basics reviewed;Hand express;Breast compression;Coconut oil;Expressed milk;Comfort gels;Hand pump;Education  Discharge Discharge Education: Engorgement and breast care Pump: Hands Free;Manual;Personal;Referral sent  for [facility] Pump  Consult Status Consult Status: Complete Date: 12/28/23 Follow-up type: In-patient    [facility] 12/28/2023, 12:02 PM

## 2023-12-28 NOTE — Progress Notes (Signed)
 POSTPARTUM PROGRESS NOTE  Subjective: LUTTIE SPARHAWK is a 28 y.o. [id number] s/p LTCS at [redacted]w[redacted]d.  She reports she doing well. No acute events overnight. She denies any problems with ambulating, voiding or po intake. Denies nausea or vomiting. She has passed flatus. Pain is well controlled.  Lochia is appropriate.  Objective: Blood pressure 115/66, pulse 91, temperature 97.9 F (36.6 C), temperature source Oral, resp. rate 18, height 5\' 2"  (1.575 m), weight 105.7 kg, last menstrual period 03/23/2023, SpO2 100%, unknown if currently breastfeeding.  Physical Exam:  General: alert, cooperative and no distress Chest: no respiratory distress Abdomen: soft, non-tender  Uterine Fundus: firm and at level of umbilicus Extremities: No calf swelling or tenderness  no edema  Recent Labs    12/25/23 1728 12/26/23 0534  HGB 12.8 11.7*  HCT 40.2 35.3*    Assessment/Plan: Aleece APRYLE ROETHLER is a 28 y.o. [id number] s/p repeat LTCS at [redacted]w[redacted]d for elective.  Routine Postpartum Care: Doing well, pain well-controlled.  -- Continue routine care, lactation support  -- Contraception: OCPs -- Feeding: both  Dispo: Plan for discharge today.  Rayma Calandra, DO Faculty Practice, Center for [facility] 12/28/2023 8:36 AM

## 2024-01-02 ENCOUNTER — Ambulatory Visit

## 2024-01-04 ENCOUNTER — Ambulatory Visit (INDEPENDENT_AMBULATORY_CARE_PROVIDER_SITE_OTHER)

## 2024-01-04 VITALS — BP 118/77 | HR 78

## 2024-01-04 DIAGNOSIS — Z4889 Encounter for other specified surgical aftercare: Secondary | ICD-10-CM

## 2024-01-04 NOTE — Progress Notes (Signed)
 Subjective:     Grace Harper is a 28 y.o. female who presents to the clinic 1 weeks status post low uterine, transverse cesarean section. Pt reports incision is healing well.      Objective:    LMP 03/23/2023  General:  alert, well appearing, in no apparent distress  Incision:   healing well, no drainage, no erythema, no hernia, no seroma, no swelling, no dehiscence, incision well approximated     Assessment:    Doing well postoperatively. Honeycomb dressing in place, okay to remove now. Honeycomb dressing removed. Incision healing well with no signs of infection.    Plan:    1. Continue any current medications. 2. Wound care discussed. 3. Follow up: PP visit or PRN.   Ina Manas, RN

## 2024-01-12 NOTE — Progress Notes (Signed)
See NST note.

## 2024-02-06 ENCOUNTER — Encounter: Payer: Self-pay | Admitting: Advanced Practice Midwife

## 2024-02-06 ENCOUNTER — Ambulatory Visit (INDEPENDENT_AMBULATORY_CARE_PROVIDER_SITE_OTHER): Admitting: Advanced Practice Midwife

## 2024-02-06 VITALS — BP 115/72 | HR 70 | Ht 60.0 in | Wt 203.5 lb

## 2024-02-06 DIAGNOSIS — Z3009 Encounter for other general counseling and advice on contraception: Secondary | ICD-10-CM | POA: Diagnosis not present

## 2024-02-06 DIAGNOSIS — O0990 Supervision of high risk pregnancy, unspecified, unspecified trimester: Secondary | ICD-10-CM

## 2024-02-06 MED ORDER — NORELGESTROMIN-ETH ESTRADIOL 150-35 MCG/24HR TD PTWK
1.0000 | MEDICATED_PATCH | TRANSDERMAL | 12 refills | Status: DC
Start: 2024-02-06 — End: 2024-04-25

## 2024-02-06 NOTE — Progress Notes (Signed)
 Post Partum Visit Note  Grace Harper is a 28 y.o. [phone] female who presents for a postpartum visit. She is 6 weeks postpartum following a repeat cesarean section.  I have fully reviewed the prenatal and intrapartum course. The delivery was at 39 gestational weeks.  Anesthesia: epidural. Postpartum course has been good. Baby is doing well. Baby is feeding by bottle - Similac Advance. Bleeding no bleeding. Bowel function is normal. Bladder function is normal. Patient is not sexually active. Contraception method is none. Postpartum depression screening: negative.   The pregnancy intention screening data noted above was reviewed. Potential methods of contraception were discussed. The patient elected to proceed with No data recorded.   Edinburgh Postnatal Depression Scale - 02/06/24 1138       Edinburgh Postnatal Depression Scale:  In the Past 7 Days   I have been able to laugh and see the funny side of things. 0    I have looked forward with enjoyment to things. 0    I have blamed myself unnecessarily when things went wrong. 0    I have been anxious or worried for no good reason. 0    I have felt scared or panicky for no good reason. 0    Things have been getting on top of me. 0    I have been so unhappy that I have had difficulty sleeping. 0    I have felt sad or miserable. 0    I have been so unhappy that I have been crying. 0    The thought of harming myself has occurred to me. 0    Edinburgh Postnatal Depression Scale Total 0          Health Maintenance Due  Topic Date Due   COVID-19 Vaccine (1 - 2024-25 season) Never done   DTaP/Tdap/Td (9 - Td or Tdap) 05/12/2023    The following portions of the patient's history were reviewed and updated as appropriate: allergies, current medications, past family history, past medical history, past social history, past surgical history, and problem list.  Review of Systems Pertinent items noted in HPI and remainder of comprehensive  ROS otherwise negative.  Objective:  BP 115/72   Pulse 70   Ht 5' (1.524 m)   Wt 203 lb 8 oz (92.3 kg)   LMP 01/22/2024 (Approximate)   Breastfeeding No   BMI 39.74 kg/m    VS reviewed, nursing note reviewed,  Constitutional: well developed, well nourished, no distress HEENT: normocephalic CV: normal rate Pulm/chest wall: normal effort Abdomen: soft Neuro: alert and oriented x 3 Skin: warm, dry Psych: affect normal   Assessment:  1. Postpartum care following cesarean delivery   2. Encounter for counseling regarding contraception (Primary)  - norelgestromin -ethinyl estradiol  (XULANE) 150-35 MCG/24HR transdermal patch; Place 1 patch onto the skin once a week.  Dispense: 3 patch; Refill: 12   Plan:   Essential components of care per ACOG recommendations:  1.  Mood and well being: Patient with negative depression screening today. Reviewed local resources for support.  - Patient tobacco use? No.   - hx of drug use? No.    2. Infant care and feeding:  -Patient currently breastmilk feeding? No.  -Social determinants of health (SDOH) reviewed in EPIC. No concerns   3. Sexuality, contraception and birth spacing - Patient does not want a pregnancy in the next year.   - Reviewed reproductive life planning. Reviewed contraceptive methods based on pt preferences and effectiveness.  Patient desired Contraceptive  Patch today.  No risk factors for estrogen.   - Discussed birth spacing of 18 months  4. Sleep and fatigue -Encouraged family/partner/community support of 4 hrs of uninterrupted sleep to help with mood and fatigue  5. Physical Recovery  - Discussed patients delivery and complications.  - Patient had a C-section repeat; no problems after delivery - Patient has urinary incontinence? No. - Patient is safe to resume physical and sexual activity  6.  Health Maintenance - HM due items addressed Yes - Last pap smear  Diagnosis  Date Value Ref Range Status  06/20/2023    Final   - Negative for intraepithelial lesion or malignancy (NILM)   Pap smear not done at today's visit.  -Breast Cancer screening indicated? No.   7. Chronic Disease/Pregnancy Condition follow up: None  - PCP follow up  Arlester Bence, CNM Center for [employer], [facility]

## 2024-04-25 ENCOUNTER — Telehealth: Admitting: Family

## 2024-04-25 DIAGNOSIS — K047 Periapical abscess without sinus: Secondary | ICD-10-CM | POA: Diagnosis not present

## 2024-04-25 MED ORDER — AMOXICILLIN-POT CLAVULANATE 875-125 MG PO TABS: 1.0000 | ORAL_TABLET | Freq: Two times a day (BID) | ORAL | 0 refills | Status: AC

## 2024-04-25 NOTE — Progress Notes (Signed)
 E-Visit for Dental Pain  We are sorry that you are not feeling well.  Here is how we plan to help!  Based on what you have shared with me in the questionnaire, it sounds like you have dental abscess.   Augmentin  875-125mg  twice a day for 10 days  It is imperative that you see a dentist within 10 days of this eVisit to determine the cause of the dental pain and be sure it is adequately treated  A toothache or tooth pain is caused when the nerve in the root of a tooth or surrounding a tooth is irritated. Dental (tooth) infection, decay, injury, or loss of a tooth are the most common causes of dental pain. Pain may also occur after an extraction (tooth is pulled out). Pain sometimes originates from other areas and radiates to the jaw, thus appearing to be tooth pain.Bacteria growing inside your mouth can contribute to gum disease and dental decay, both of which can cause pain. A toothache occurs from inflammation of the central portion of the tooth called pulp. The pulp contains nerve endings that are very sensitive to pain. Inflammation to the pulp or pulpitis may be caused by dental cavities, trauma, and infection.    HOME CARE:   For toothaches: Over-the-counter pain medications such as acetaminophen  or ibuprofen  may be used. Take these as directed on the package while you arrange for a dental appointment. Avoid very cold or hot foods, because they may make the pain worse. You may get relief from biting on a cotton ball soaked in oil of cloves. You can get oil of cloves at most drug stores.  For jaw pain:  Aspirin  may be helpful for problems in the joint of the jaw in adults. If pain happens every time you open your mouth widely, the temporomandibular joint (TMJ) may be the source of the pain. Yawning or taking a large bite of food may worsen the pain. An appointment with your doctor or dentist will help you find the cause.     GET HELP RIGHT AWAY IF:  You have a high fever or chills If  you have had a recent head or face injury and develop headache, light headedness, nausea, vomiting, or other symptoms that concern you after an injury to your face or mouth, you could have a more serious injury in addition to your dental injury. A facial rash associated with a toothache: This condition may improve with medication. Contact your doctor for them to decide what is appropriate. Any jaw pain occurring with chest pain: Although jaw pain is most commonly caused by dental disease, it is sometimes referred pain from other areas. People with heart disease, especially people who have had stents placed, people with diabetes, or those who have had heart surgery may have jaw pain as a symptom of heart attack or angina. If your jaw or tooth pain is associated with lightheadedness, sweating, or shortness of breath, you should see a doctor as soon as possible. Trouble swallowing or excessive pain or bleeding from gums: If you have a history of a weakened immune system, diabetes, or steroid use, you may be more susceptible to infections. Infections can often be more severe and extensive or caused by unusual organisms. Dental and gum infections in people with these conditions may require more aggressive treatment. An abscess may need draining or IV antibiotics, for example.  MAKE SURE YOU   Understand these instructions. Will watch your condition. Will get help right away if you are  not doing well or get worse.  Thank you for choosing an e-visit.  Your e-visit answers were reviewed by a board certified advanced clinical practitioner to complete your personal care plan. Depending upon the condition, your plan could have included both over the counter or prescription medications.  Please review your pharmacy choice. Make sure the pharmacy is open so you can pick up prescription now. If there is a problem, you may contact your provider through [employer] and have the prescription routed to another  pharmacy.  Your safety is important to us . If you have drug allergies check your prescription carefully.   For the next 24 hours you can use MyChart to ask questions about today's visit, request a non-urgent call back, or ask for a work or school excuse. You will get an email in the next two days asking about your experience. I hope that your e-visit has been valuable and will speed your recovery.   Approximately 5 minutes was spent documenting and reviewing patient's chart.
